# Patient Record
Sex: Female | Born: 1955 | Race: White | Hispanic: No | Smoking: Never smoker
Health system: Southern US, Community
[De-identification: ages and names within clinical notes are randomized; demographics above are authoritative.]

## PROBLEM LIST (undated history)

## (undated) DIAGNOSIS — Z96649 Presence of unspecified artificial hip joint: Secondary | ICD-10-CM

## (undated) DIAGNOSIS — K219 Gastro-esophageal reflux disease without esophagitis: Secondary | ICD-10-CM

## (undated) DIAGNOSIS — M21379 Foot drop, unspecified foot: Secondary | ICD-10-CM

## (undated) DIAGNOSIS — E039 Hypothyroidism, unspecified: Secondary | ICD-10-CM

## (undated) DIAGNOSIS — M810 Age-related osteoporosis without current pathological fracture: Secondary | ICD-10-CM

## (undated) DIAGNOSIS — F419 Anxiety disorder, unspecified: Secondary | ICD-10-CM

## (undated) DIAGNOSIS — F32A Depression, unspecified: Secondary | ICD-10-CM

## (undated) DIAGNOSIS — G629 Polyneuropathy, unspecified: Secondary | ICD-10-CM

## (undated) DIAGNOSIS — G8929 Other chronic pain: Secondary | ICD-10-CM

## (undated) DIAGNOSIS — I10 Essential (primary) hypertension: Secondary | ICD-10-CM

## (undated) DIAGNOSIS — G4730 Sleep apnea, unspecified: Secondary | ICD-10-CM

## (undated) DIAGNOSIS — G43909 Migraine, unspecified, not intractable, without status migrainosus: Secondary | ICD-10-CM

## (undated) DIAGNOSIS — F329 Major depressive disorder, single episode, unspecified: Secondary | ICD-10-CM

## (undated) HISTORY — PX: OTHER SURGICAL HISTORY: SHX169

## (undated) HISTORY — PX: JOINT REPLACEMENT: SHX530

## (undated) HISTORY — PX: PLANTAR FASCIA RELEASE: SHX2239

## (undated) HISTORY — DX: Presence of unspecified artificial hip joint: Z96.649

## (undated) HISTORY — PX: BACK SURGERY: SHX140

## (undated) HISTORY — PX: ABDOMINAL HYSTERECTOMY: SHX81

## (undated) HISTORY — PX: HIP SURGERY: SHX245

## (undated) HISTORY — DX: Essential (primary) hypertension: I10

## (undated) HISTORY — PX: CHOLECYSTECTOMY: SHX55

## (undated) HISTORY — PX: NEPHRECTOMY: SHX65

---

## 2008-03-14 ENCOUNTER — Emergency Department (HOSPITAL_COMMUNITY): Admission: EM | Admit: 2008-03-14 | Discharge: 2008-03-14 | Payer: Self-pay | Admitting: Emergency Medicine

## 2010-08-21 ENCOUNTER — Inpatient Hospital Stay (HOSPITAL_COMMUNITY): Admission: EM | Admit: 2010-08-21 | Discharge: 2010-08-25 | Payer: Self-pay | Admitting: Emergency Medicine

## 2010-08-21 ENCOUNTER — Ambulatory Visit: Payer: Self-pay | Admitting: Orthopedic Surgery

## 2010-08-22 ENCOUNTER — Encounter: Payer: Self-pay | Admitting: Orthopedic Surgery

## 2010-09-03 ENCOUNTER — Ambulatory Visit: Payer: Self-pay | Admitting: Orthopedic Surgery

## 2010-09-03 ENCOUNTER — Ambulatory Visit (HOSPITAL_COMMUNITY): Admission: RE | Admit: 2010-09-03 | Discharge: 2010-09-03 | Payer: Self-pay | Admitting: Orthopedic Surgery

## 2010-09-03 DIAGNOSIS — S72009A Fracture of unspecified part of neck of unspecified femur, initial encounter for closed fracture: Secondary | ICD-10-CM | POA: Insufficient documentation

## 2010-10-02 ENCOUNTER — Ambulatory Visit: Payer: Self-pay | Admitting: Orthopedic Surgery

## 2010-10-31 ENCOUNTER — Ambulatory Visit
Admission: RE | Admit: 2010-10-31 | Discharge: 2010-10-31 | Payer: Self-pay | Source: Home / Self Care | Attending: Orthopedic Surgery | Admitting: Orthopedic Surgery

## 2010-11-27 NOTE — Miscellaneous (Signed)
Summary: Nursing Home order  Nursing Home order   Imported By: Cammie Sickle 09/21/2010 14:28:55  _____________________________________________________________________  External Attachment:    Type:   Image     Comment:   External Document

## 2010-11-27 NOTE — Assessment & Plan Note (Signed)
Summary: 4 WK/RECK/? XRAY / POST OP RT HIP SURG 08/22/10 /MEDICARE/CAF   Visit Type:  Follow-up  CC:  post op hip.  History of Present Illness: I saw Victoria Bailey in the office today for a followup visit.  She is a 55 years old woman with the complaint of:  post op, right hip  DOI 08-21-10.  DOS 08-22-10. Right hip partial replacement.  Medications:Hydrocodone 10/325 mg, Robaxin 750 mg, Fentanyl patch 50 micrograms, Baclofen.  Today is 4 week recheck.  the patient is still at the skilled nursing facility in [place].  She is complaining of groin pain and thigh pain and weakness of her RIGHT leg.  She has an associated footdrop from her previous lumbar disc surgeries.  She is not gotten her new AFO  Radiographs will be taken today.      Physical Exam  Additional Exam:  on examination she asked he walks fairly well with a walker she has a mild foot drop it is partial.  She can lift her leg as long as her knee is bad but when it is straight she has trouble with hip flexion.  She has no tenderness in the thigh but deep dull aching.  With flexion-extension of the hip she has mild discomfort in the groin.     Impression & Recommendations:  Problem # 1:  CLOSED FRACTURE UNSPECIFIED PART NECK FEMUR (ICD-820.8) Assessment Comment Only  radiographs are taken of her RIGHT hip prosthesis and the RIGHT femur.  There no abnormality seen.  The prosthesis is in good position.  There is no heterotopic bone.  Impression normal appearing stable RIGHT hip bipolar replacement  At this point the patient tells me that she is ambulating about 100 feet, she can get in and out of bed by herself.  So we anticipate her leaving the facility in about a week and then transferring to outpatient or home therapy.  Orders: Post-Op Check (47829) Hip x-ray unilateral complete, minimum 2 views (73510) Pelvis x-ray, 1/2 views (72170) Femur x-ray,  2 views (56213)  Patient Instructions: 1)  Get AFO  brace for the right foot. 2)  Please schedule a follow-up appointment in 1 month.   Orders Added: 1)  Post-Op Check [99024] 2)  Hip x-ray unilateral complete, minimum 2 views [73510] 3)  Pelvis x-ray, 1/2 views [72170] 4)  Femur x-ray,  2 views [73550]

## 2010-11-27 NOTE — Letter (Signed)
Summary: History form  History form   Imported By: Jacklynn Ganong 09/06/2010 10:01:32  _____________________________________________________________________  External Attachment:    Type:   Image     Comment:   External Document

## 2010-11-27 NOTE — Assessment & Plan Note (Signed)
Summary: HOSP FOL/UP/POST OP RT HIP SURG 08/22/10/XRAY PRIOR,SAME DAY ...   Visit Type:  post op  CC:  post op right hip.  History of Present Illness: I saw Victoria Bailey in the office today for a followup visit.  She is a 55 years old woman with the complaint of:  post op, right hip  DOI 08-21-10.  DOS 08-22-10. Right hip partial replacement.  Medications:Hydrocodone 10/325 mg, Robaxin 750 mg, Fentanyl patch 50 micrograms.  Staples out today.   We took out every other staple and the wound edges looked to be a little bit separated so we LEFT the rest of the minimally taken out at the rehabilitation center.  The patient is at the rehabilitation center nonweightbearing secondary to a previously noted. Endoscopic plantar fascial release. Just prior to her fractured hip.  She is in rehabilitation and bleeding 100 feet nonweightbearing with a AFO device on her RIGHT foot, secondary to her foot drop, which is related to previous lumbar disc pathology, surgery.  She also has chronic pain, and sees a pain management specialist in [place];  other than that: Her incision looks good and her hip has minimal swelling.  Recommend four-week followup Staples to be removed in 3 days at the rehabilitation center. Once she sees Dr. Pricilla Holm. She can start weightbearing as tolerated    Impression & Recommendations:  Problem # 1:  CLOSED FRACTURE UNSPECIFIED PART NECK FEMUR (ICD-820.8) Assessment Improved computer digital images from the hospital, AP of the pelvis, AP, lateral, of the RIGHT hip shows a RIGHT partial hip replacement with alignment, similar to previously noted. Alignment on postop film has been no interval change.  Alignment satisfactory.  Patient will return in 4 weeks for clinical examination Orders: Post-Op Check [phone])  Patient Instructions: 1)  Rehab:  take staples out in 4 days  2)  return here in 4 weeks for check up    Orders Added: 1)  Post-Op Check  [[id number]]

## 2010-11-28 ENCOUNTER — Encounter: Payer: Self-pay | Admitting: Orthopedic Surgery

## 2010-11-29 NOTE — Assessment & Plan Note (Signed)
Summary: 1 mo reck hip/mcr/bsf   Visit Type:  Follow-up  CC:  recheck hip.  History of Present Illness: I saw Victoria Bailey in the office today for a followup visit.  She is a 55 years old woman with the complaint of:  post op, right hip  DOI 08-21-10.  DOS 08-22-10. Right hip partial replacement.  Medications:Hydrocodone 10/500 mg, Robaxin 750 mg, Fentanyl patch 50 micrograms, Baclofen.  Today is 4 week recheck, was to get AFO for her right foot, is in process has not received yet.  Walking today with cane.  Still pain in right thigh and lateral hip, no injuries.  Needs refill on the Hydrocodone 10/500.  Needs rx for outpatient therapy.  Now at home with her MOM           Impression & Recommendations:  Problem # 1:  CLOSED FRACTURE UNSPECIFIED PART NECK FEMUR (ICD-820.8)  several issues #1 is having groin pain and pain in the RIGHT proximal thigh radiating to the back, which we suspect is coming from iliopsoas muscle release from the lesser trochanter and combination of subclinical L1, L2, dysfunction.  Question about driving, probably not quite since she is still having weakness with straight leg raise. When lying down. She is much better with this when sitting.  Okay to go to outpatient therapy  Orders: Post-Op Check [phone])  Medications Added to Medication List This Visit: 1)  Hydrocodone-acetaminophen 10-500 Mg Tabs (Hydrocodone-acetaminophen) .Marland Kitchen.. 1 by mouth q 4 hrs as needed pain  Patient Instructions: 1)  Wait on driving 2)  Go for outpatient therapy, take order with you 3)  Get AFO 4)  Come back in 1 month Prescriptions: HYDROCODONE-ACETAMINOPHEN 10-500 MG TABS (HYDROCODONE-ACETAMINOPHEN) 1 by mouth q 4 hrs as needed pain  #84 x 2   Entered and Authorized by:   Fuller Canada MD   Signed by:   Fuller Canada MD on 10/31/2010   Method used:   Print then Give to Patient   RxID:   [id number]    Orders Added: 1)  Post-Op Check  [95621]

## 2010-12-04 ENCOUNTER — Ambulatory Visit (INDEPENDENT_AMBULATORY_CARE_PROVIDER_SITE_OTHER): Payer: Medicare Other | Admitting: Orthopedic Surgery

## 2010-12-04 ENCOUNTER — Encounter: Payer: Self-pay | Admitting: Orthopedic Surgery

## 2010-12-04 DIAGNOSIS — S72009A Fracture of unspecified part of neck of unspecified femur, initial encounter for closed fracture: Secondary | ICD-10-CM

## 2010-12-13 NOTE — Miscellaneous (Signed)
Summary: Rehab Report [facility]  Rehab Report [facility]   Imported By: Cammie Sickle 12/04/2010 19:36:00  _____________________________________________________________________  External Attachment:    Type:   Image     Comment:   External Document

## 2010-12-13 NOTE — Medication Information (Signed)
Summary: Tax adviser   Imported By: Cammie Sickle 12/04/2010 19:37:14  _____________________________________________________________________  External Attachment:    Type:   Image     Comment:   External Document

## 2010-12-13 NOTE — Assessment & Plan Note (Signed)
Summary: 1 mth reck hip/mcr/wkj   Visit Type:  Follow-up  CC:  RIGHT groin pain.  History of Present Illness:   55 year old female complains of RIGHT groin pain radiating to her back in a bandlike distribution.  Status post RIGHT partial hip replacement.  DOI 08-21-10.  DOS 08-22-10. Right hip partial replacement.  Medications:Hydrocodone 10/500 mg, Robaxin 750 mg, Fentanyl patch 50 micrograms, Baclofen.  ALLERGIES to sulfa, doxycycline.  In tolerance to OxyContin makes her sick    The patient states she is better than she was a month ago.  She has better strength and endurance and she is ambulating better although with a cane.  She still complains of a ropelike sensation around the groin area which radiates up into her back.  She has a history of chronic back pain with radiculopathy and RIGHT foot drop.  Therapy notes indicate patient has improved significantly since her last visit.  She has an AFO now and also has 1/4 inch heel lift in her shoe.  She feels like she is walking unbalanced.           Review of Systems Constitutional:  Denies fever. Neurologic:  Complains of unsteady gait; denies numbness and tingling. Musculoskeletal:  Complains of joint pain, instability, and muscle pain.  Physical Exam  Additional Exam:   She is a lady with a cane with a slight abductor lurch  She has leg length discrepancy when standing which is improved when they heel lifts are removed from the shoe however, she still has the AFO which is contributing.  She did block test today and I balanced her with 1/4 inch lift in the LEFT.  I would like to try to remove the quarter inch heel lifts from the RIGHT shoe and see if this helps her out.  I would also like to start her on some amitriptyline 10 mg at night to help with her radicular groin pain.     Medications Added to Medication List This Visit: 1)  Amitriptyline Hcl 10 Mg Tabs (Amitriptyline hcl) .... Take one at night by  mouth  Other Orders: Physical Therapy Referral (PT) Est. Patient Level III (16109)  Patient Instructions: 1)  continue therapy for a month 2)  fu in a month Prescriptions: AMITRIPTYLINE HCL 10 MG TABS (AMITRIPTYLINE HCL) take one at night by mouth  #30 x 1   Entered and Authorized by:   Fuller Canada MD   Signed by:   Fuller Canada MD on 12/04/2010   Method used:   Handwritten   RxID:   [id number]    Orders Added: 1)  Physical Therapy Referral [PT] 2)  Est. Patient Level III [95621]

## 2011-01-03 ENCOUNTER — Ambulatory Visit (INDEPENDENT_AMBULATORY_CARE_PROVIDER_SITE_OTHER): Payer: Medicare Other | Admitting: Orthopedic Surgery

## 2011-01-03 ENCOUNTER — Encounter: Payer: Self-pay | Admitting: Orthopedic Surgery

## 2011-01-03 DIAGNOSIS — S72009A Fracture of unspecified part of neck of unspecified femur, initial encounter for closed fracture: Secondary | ICD-10-CM

## 2011-01-08 ENCOUNTER — Telehealth: Payer: Self-pay | Admitting: Orthopedic Surgery

## 2011-01-08 NOTE — Assessment & Plan Note (Signed)
Summary: 1 m RE-CK RT HIP FOL'G PT/?XRAY/MEDICARE/BCBS ANTHEM/CAF   Visit Type:  Follow-up  CC:  recheck hip.  History of Present Illness:  55 year old female complains of RIGHT groin pain radiating to her back in a bandlike distribution.  4 mos status post RIGHT partial hip replacement.  DOI 08-21-10.  DOS 08-22-10. Right hip partial replacement.  Medications:Hydrocodone 10/500 mg, Robaxin 750 mg, Fentanyl patch 50 micrograms, Baclofen and Amitriptyline.  Today is a one month followup since her last visit. She is still undergoing physical therapy and she uses a cane and she has a RIGHT foot AFO for footdrop.  The amitriptyline seems to be helping. The pain that she is having radiating to the back from the groin.  ALLERGIES to sulfa, doxycycline.  In tolerance to OxyContin makes her sick  PT seems to be going well. Patient reports getting more strength, but still has weakness in the RIGHT leg.  Sees Dr. Felix Ahmadi in [place]/ pain management        Physical Exam  Additional Exam:  RIGHT leg length discrepancy, approximately 1 half-inch measure 5 block test. When she is seated. There is no discrepancy however, when she is lying flat. The discrepancy measures at half-inch.  She can now perform a straight leg raise. She has normal flexion of the hip with the knee bent.  She ambulates with a Trendelenburg gait.   Impression & Recommendations:  Problem # 1:  CLOSED FRACTURE UNSPECIFIED PART NECK FEMUR (ICD-820.8) Assessment Improved  x-ray review shows discrepancy on the AP, pelvis.  She has a foot drop, which is getting better and her doctors would like her to take off the AFO and she is agreeable to that however, that when require a shoe lift on the RIGHT upper proximally. Half inch. We measured from the block test, and three quarters of an inch seem to be too much of a LEFT.  She will continue therapy to strengthen her abductors. Prior to her hip surgery. She had a foot  drop, which indicates that there is some L5 nerve root involvement. She's had lower lumbar surgery as well. That taken with the normal weakness seen after direct lateral approach to the hip would explain her persistent abductor weakness.  The leg length discrepancy. He seems to be related to the hip and the back.  Orders: Est. Patient Level III (14782)  Patient Instructions: 1)  continue Amitriptiline  2)  1/2 in lift  3)  f/u 1 month  4)  continue PT    Orders Added: 1)  Est. Patient Level III [95621]

## 2011-01-09 LAB — CBC
HCT: 25.8 % — ABNORMAL LOW (ref 36.0–46.0)
Hemoglobin: 10.1 g/dL — ABNORMAL LOW (ref 12.0–15.0)
Hemoglobin: 11.1 g/dL — ABNORMAL LOW (ref 12.0–15.0)
Hemoglobin: 13 g/dL (ref 12.0–15.0)
Hemoglobin: 8.8 g/dL — ABNORMAL LOW (ref 12.0–15.0)
MCH: 30.4 pg (ref 26.0–34.0)
MCHC: 33.7 g/dL (ref 30.0–36.0)
MCHC: 35.6 g/dL (ref 30.0–36.0)
Platelets: 207 10*3/uL (ref 150–400)
RBC: 2.88 MIL/uL — ABNORMAL LOW (ref 3.87–5.11)
RBC: 4.26 MIL/uL (ref 3.87–5.11)
RDW: 13.1 % (ref 11.5–15.5)
RDW: 13.3 % (ref 11.5–15.5)
RDW: 13.7 % (ref 11.5–15.5)
WBC: 7 10*3/uL (ref 4.0–10.5)
WBC: 7.4 10*3/uL (ref 4.0–10.5)

## 2011-01-09 LAB — COMPREHENSIVE METABOLIC PANEL
ALT: 119 U/L — ABNORMAL HIGH (ref 0–35)
AST: 160 U/L — ABNORMAL HIGH (ref 0–37)
Albumin: 3.3 g/dL — ABNORMAL LOW (ref 3.5–5.2)
CO2: 26 mEq/L (ref 19–32)
Calcium: 8.9 mg/dL (ref 8.4–10.5)
Chloride: 109 mEq/L (ref 96–112)
Creatinine, Ser: 1.14 mg/dL (ref 0.4–1.2)
GFR calc Af Amer: >60 mL/min (ref >60)
GFR calc non Af Amer: 50 mL/min — ABNORMAL LOW (ref >60)
Sodium: 142 mEq/L (ref 135–145)
Total Bilirubin: 0.8 mg/dL (ref 0.3–1.2)

## 2011-01-09 LAB — BASIC METABOLIC PANEL
BUN: 5 mg/dL — ABNORMAL LOW (ref 6–23)
BUN: 6 mg/dL (ref 6–23)
CO2: 27 mEq/L (ref 19–32)
Calcium: 8.6 mg/dL (ref 8.4–10.5)
Calcium: 9 mg/dL (ref 8.4–10.5)
Creatinine, Ser: 1.17 mg/dL (ref 0.4–1.2)
GFR calc non Af Amer: 47 mL/min — ABNORMAL LOW (ref >60)
GFR calc non Af Amer: 48 mL/min — ABNORMAL LOW (ref >60)
GFR calc non Af Amer: 51 mL/min — ABNORMAL LOW (ref >60)
Glucose, Bld: 108 mg/dL — ABNORMAL HIGH (ref 70–99)
Glucose, Bld: 128 mg/dL — ABNORMAL HIGH (ref 70–99)
Potassium: 3.4 mEq/L — ABNORMAL LOW (ref 3.5–5.1)
Sodium: 137 mEq/L (ref 135–145)
Sodium: 141 mEq/L (ref 135–145)
Sodium: 142 mEq/L (ref 135–145)

## 2011-01-09 LAB — ABO/RH: ABO/RH(D): A NEG

## 2011-01-09 LAB — DIFFERENTIAL
Basophils Absolute: 0 10*3/uL (ref 0.0–0.1)
Basophils Absolute: 0 10*3/uL (ref 0.0–0.1)
Basophils Absolute: 0 10*3/uL (ref 0.0–0.1)
Basophils Relative: 0 % (ref 0–1)
Basophils Relative: 0 % (ref 0–1)
Eosinophils Absolute: 0 10*3/uL (ref 0.0–0.7)
Eosinophils Absolute: 0.1 10*3/uL (ref 0.0–0.7)
Eosinophils Relative: 1 % (ref 0–5)
Lymphocytes Relative: 11 % — ABNORMAL LOW (ref 12–46)
Lymphocytes Relative: 11 % — ABNORMAL LOW (ref 12–46)
Lymphocytes Relative: 9 % — ABNORMAL LOW (ref 12–46)
Lymphs Abs: 0.5 10*3/uL — ABNORMAL LOW (ref 0.7–4.0)
Lymphs Abs: 0.8 10*3/uL (ref 0.7–4.0)
Monocytes Absolute: 0.3 10*3/uL (ref 0.1–1.0)
Monocytes Absolute: 0.5 10*3/uL (ref 0.1–1.0)
Monocytes Relative: 7 % (ref 3–12)
Monocytes Relative: 9 % (ref 3–12)
Neutro Abs: 5.5 10*3/uL (ref 1.7–7.7)
Neutro Abs: 5.6 10*3/uL (ref 1.7–7.7)
Neutro Abs: 6 10*3/uL (ref 1.7–7.7)
Neutrophils Relative %: 78 % — ABNORMAL HIGH (ref 43–77)
Neutrophils Relative %: 81 % — ABNORMAL HIGH (ref 43–77)

## 2011-01-09 LAB — CROSSMATCH
ABO/RH(D): A NEG
Antibody Screen: NEGATIVE
Unit division: 0

## 2011-01-09 LAB — URINALYSIS, ROUTINE W REFLEX MICROSCOPIC
Nitrite: NEGATIVE
Specific Gravity, Urine: 1.01 (ref 1.005–1.030)
Urobilinogen, UA: 0.2 mg/dL (ref 0.0–1.0)
pH: 7 (ref 5.0–8.0)

## 2011-01-09 LAB — URINE CULTURE: Culture: NO GROWTH

## 2011-01-09 LAB — PROTIME-INR: Prothrombin Time: 12.7 seconds (ref 11.6–15.2)

## 2011-01-09 LAB — SAMPLE TO BLOOD BANK

## 2011-01-15 NOTE — Progress Notes (Signed)
Summary: needs PT order  Phone Note Call from Patient   Summary of Call: [facility]  needs a new order for Dia Sitter to continue PT Initial call taken by: Jacklynn Ganong,  January 08, 2011 4:02 PM  Follow-up for Phone Call        ok faxed Follow-up by: Ether Griffins,  January 09, 2011 9:01 AM

## 2011-01-31 ENCOUNTER — Telehealth: Payer: Self-pay | Admitting: Orthopedic Surgery

## 2011-01-31 NOTE — Telephone Encounter (Signed)
Is she allergic to any medications

## 2011-01-31 NOTE — Telephone Encounter (Signed)
Per call from Dr Karna Dupes (dentist) office - ph [phone]  - please prescribe RX for pre-medication, due to patient hx of partial hip replacement.  Call made to patient per call about pre-med per Dr.Grogan, [place] - LEFT MSG ON ANSWER MACHINE- NEED PATIENT TO CALL BACK WITH NAME OF PHARMACY.

## 2011-01-31 NOTE — Telephone Encounter (Signed)
Patient gave info:  [facility] [address],[place] [facility] [phone] for pre-med dental Rx. Patient states allergic to Doxycycline

## 2011-02-04 NOTE — Telephone Encounter (Signed)
I cant find the Info on prescribing Antibiotics for Total relpacements, please advise which med is accurate for her to take before dentist visit, thanks

## 2011-02-06 ENCOUNTER — Other Ambulatory Visit (INDEPENDENT_AMBULATORY_CARE_PROVIDER_SITE_OTHER): Payer: Medicare Other | Admitting: *Deleted

## 2011-02-06 DIAGNOSIS — Z96649 Presence of unspecified artificial hip joint: Secondary | ICD-10-CM

## 2011-02-06 MED ORDER — CEPHALEXIN 500 MG PO CAPS: 500.0000 mg | ORAL_CAPSULE | ORAL | Status: AC

## 2011-02-06 NOTE — Telephone Encounter (Signed)
Keflex 500 mg 4 tabs 1 hour before procedure

## 2011-02-06 NOTE — Telephone Encounter (Signed)
Keflex 500 mg 4 tabs 1 hour before

## 2011-02-11 ENCOUNTER — Other Ambulatory Visit: Payer: Self-pay | Admitting: *Deleted

## 2011-02-11 DIAGNOSIS — R52 Pain, unspecified: Secondary | ICD-10-CM

## 2011-02-11 MED ORDER — AMITRIPTYLINE HCL 10 MG PO TABS: 10.0000 mg | ORAL_TABLET | Freq: Every day | ORAL | Status: DC

## 2011-02-14 ENCOUNTER — Ambulatory Visit: Payer: Medicare Other | Admitting: Orthopedic Surgery

## 2011-02-27 ENCOUNTER — Ambulatory Visit (HOSPITAL_COMMUNITY)
Admission: RE | Admit: 2011-02-27 | Discharge: 2011-02-27 | Disposition: A | Discharge status: Home / Self Care | Payer: Medicare Other | Source: Ambulatory Visit | Attending: Orthopedic Surgery | Admitting: Orthopedic Surgery

## 2011-02-27 ENCOUNTER — Other Ambulatory Visit: Payer: Self-pay | Admitting: Orthopedic Surgery

## 2011-02-27 ENCOUNTER — Ambulatory Visit: Payer: Medicare Other | Admitting: Orthopedic Surgery

## 2011-02-27 ENCOUNTER — Encounter: Payer: Self-pay | Admitting: Orthopedic Surgery

## 2011-02-27 ENCOUNTER — Ambulatory Visit (INDEPENDENT_AMBULATORY_CARE_PROVIDER_SITE_OTHER): Payer: Medicare Other | Admitting: Orthopedic Surgery

## 2011-02-27 DIAGNOSIS — M25559 Pain in unspecified hip: Secondary | ICD-10-CM

## 2011-02-27 DIAGNOSIS — M79609 Pain in unspecified limb: Secondary | ICD-10-CM | POA: Insufficient documentation

## 2011-02-27 DIAGNOSIS — T1490XA Injury, unspecified, initial encounter: Secondary | ICD-10-CM

## 2011-02-27 DIAGNOSIS — R52 Pain, unspecified: Secondary | ICD-10-CM

## 2011-02-27 DIAGNOSIS — S92919A Unspecified fracture of unspecified toe(s), initial encounter for closed fracture: Secondary | ICD-10-CM | POA: Insufficient documentation

## 2011-02-27 DIAGNOSIS — X58XXXA Exposure to other specified factors, initial encounter: Secondary | ICD-10-CM | POA: Insufficient documentation

## 2011-02-27 MED ORDER — HYDROCODONE-ACETAMINOPHEN 10-500 MG PO TABS: 1.0000 | ORAL_TABLET | ORAL | Status: DC | PRN

## 2011-02-27 MED ORDER — AMITRIPTYLINE HCL 10 MG PO TABS: ORAL_TABLET | ORAL | Status: DC

## 2011-02-27 NOTE — Progress Notes (Signed)
Visit for this 55 year old female had a RIGHT bipolar hip replacement on August 22, 2010  Apparently she fell 3 weeks ago, so she did not injure her hip.  She is complaining of 5/10 pain in the RIGHT hip and RIGHT leg. Apparently, while doing physical therapy. A leg press exercise was done, and she started having increased pain at that time.  She was recently placed on amitriptyline for some radicular pain. She was having in the proximal thigh radiating from her back.  She was also given a half-inch lift on the right  Foot    Her LEFT great toe is tender and she injured it when she fell. There is crepitance at the IP joint, so we aren't going to get an x-ray [facility].  She has a slight limb length discrepancy with the half-inch shoe lift, but she has weakness there so this is advantageous versus absolute equality or longer on the RIGHT.  She has weakness in the abductors.  Advised 3 sets of 15, abduction exercises with a small weight on the foot, progress as tolerated. No leg presses.  Followup one month increase amytriptiline to 20 mg at night

## 2011-02-27 NOTE — Patient Instructions (Signed)
I will call you with xray result

## 2011-03-02 ENCOUNTER — Emergency Department (HOSPITAL_COMMUNITY)
Admission: EM | Admit: 2011-03-02 | Discharge: 2011-03-02 | Disposition: A | Discharge status: Home / Self Care | Payer: Worker's Compensation | Attending: Emergency Medicine | Admitting: Emergency Medicine

## 2011-03-02 DIAGNOSIS — G43909 Migraine, unspecified, not intractable, without status migrainosus: Secondary | ICD-10-CM | POA: Insufficient documentation

## 2011-03-02 DIAGNOSIS — M545 Low back pain, unspecified: Secondary | ICD-10-CM | POA: Insufficient documentation

## 2011-03-02 DIAGNOSIS — F411 Generalized anxiety disorder: Secondary | ICD-10-CM | POA: Insufficient documentation

## 2011-03-02 DIAGNOSIS — G8929 Other chronic pain: Secondary | ICD-10-CM | POA: Insufficient documentation

## 2011-03-02 DIAGNOSIS — F329 Major depressive disorder, single episode, unspecified: Secondary | ICD-10-CM | POA: Insufficient documentation

## 2011-03-02 DIAGNOSIS — Z79899 Other long term (current) drug therapy: Secondary | ICD-10-CM | POA: Insufficient documentation

## 2011-03-02 DIAGNOSIS — M79609 Pain in unspecified limb: Secondary | ICD-10-CM | POA: Insufficient documentation

## 2011-03-02 DIAGNOSIS — F3289 Other specified depressive episodes: Secondary | ICD-10-CM | POA: Insufficient documentation

## 2011-03-02 DIAGNOSIS — M810 Age-related osteoporosis without current pathological fracture: Secondary | ICD-10-CM | POA: Insufficient documentation

## 2011-04-02 ENCOUNTER — Ambulatory Visit (INDEPENDENT_AMBULATORY_CARE_PROVIDER_SITE_OTHER): Payer: Medicare Other | Admitting: Orthopedic Surgery

## 2011-04-02 ENCOUNTER — Encounter: Payer: Self-pay | Admitting: Orthopedic Surgery

## 2011-04-02 DIAGNOSIS — G589 Mononeuropathy, unspecified: Secondary | ICD-10-CM | POA: Insufficient documentation

## 2011-04-02 DIAGNOSIS — M25559 Pain in unspecified hip: Secondary | ICD-10-CM | POA: Insufficient documentation

## 2011-04-02 MED ORDER — AMITRIPTYLINE HCL 10 MG PO TABS: ORAL_TABLET | ORAL | Status: DC

## 2011-04-02 NOTE — Progress Notes (Signed)
Visit for this 55 year old female had a RIGHT bipolar hip replacement on August 22, 2010  Several issues since the last visit. Continues to have pain in the upper thigh and now radicular pain in her RIGHT leg after twisting injury causing her to have back pain and increase her radicular symptoms.  She had to stop her physical therapy.  Other issues, chronic pain.  Footdrop with need of AFO.  Lumbar disc disease.  She is very upset today because she can't go to therapy. She is having increased pain in the leg. We will take an x-ray of the prosthesis. There's been some concern in therapy that the hip was "popping out.  She has abductor weakness.  I tried to reassure her that her Other problems are causing issues with her RIGHT leg.  As far as the examination does I did a shuck test on her there was no shuck there is no pain there is no instability  Does have the limb length inequality as stated previously.    Plan rest for many therapy. Right now until her back issues get settled.  I recommend she see her neurosurgeon to reevaluate the radicular pain.  Followup one mo.  Addendum AP and lateral RIGHT hip no complicating features.  On previous pelvic x-rays we note a three-quarter inch leg length discrepancy which is clinically noted on the block test and we recommended 1/2 inch lift.

## 2011-04-02 NOTE — Progress Notes (Signed)
AP lateral RIGHT hip RIGHT hip prosthesis is seen in place in good position  No cup obtaining features x-ray was taken because patient was complaining of RIGHT thigh pain

## 2011-05-14 ENCOUNTER — Ambulatory Visit (INDEPENDENT_AMBULATORY_CARE_PROVIDER_SITE_OTHER): Payer: Medicare Other | Admitting: Orthopedic Surgery

## 2011-05-14 DIAGNOSIS — M7060 Trochanteric bursitis, unspecified hip: Secondary | ICD-10-CM

## 2011-05-14 DIAGNOSIS — M76899 Other specified enthesopathies of unspecified lower limb, excluding foot: Secondary | ICD-10-CM

## 2011-05-14 DIAGNOSIS — M25559 Pain in unspecified hip: Secondary | ICD-10-CM

## 2011-05-14 DIAGNOSIS — M25551 Pain in right hip: Secondary | ICD-10-CM

## 2011-05-14 MED ORDER — METHYLPREDNISOLONE ACETATE 40 MG/ML IJ SUSP: 40.0000 mg | Freq: Once | INTRAMUSCULAR | Status: AC

## 2011-05-14 NOTE — Procedures (Signed)
Injection LEFT hip  Verbal consent.  Diagnosis RIGHT hip pain 40 mg of Depo-Medrol and 4 cc of 1% lidocaine plain.  Sterile injection RIGHT hip greater trochanter No complications   The same injection was repeated distal to the incision.

## 2011-05-14 NOTE — Progress Notes (Signed)
55 year old female had a RIGHT bipolar hip replacement on August 22, 2010:  She is here for followup today.  She had epidural injections. I believe, or at least some back injections, and she seems to think that her back is gotten a lot better. She still has some medial thigh pain. Some anterior thigh pain and some pain over the greater trochanter at the distal portion of her incision.  Exam shows that she is tender in these areas. She has good hip flexion, although some weakness. She has persistent weakness of her abductors.  I gave her an injection at the distal end of the incision and also over the greater trochanter for bursitis.  She complained of some pain over the RIGHT great toe and exam. There shows tenderness over the dorsum with a palpable prominence, consistent with a dorsal bunion, although she has a painless passive range of motion except for terminal motion.  Plan is to screw start physical therapy with limited exercises for strengthening of the hip flexors and abductors.

## 2011-05-14 NOTE — Patient Instructions (Signed)
You have received a steroid shot. 15% of patients experience increased pain at the injection site with in the next 24 hours. This is best treated with ice and tylenol extra strength 2 tabs every 8 hours. If you are still having pain please call the office.    

## 2011-05-20 ENCOUNTER — Other Ambulatory Visit: Payer: Self-pay | Admitting: *Deleted

## 2011-05-20 DIAGNOSIS — G589 Mononeuropathy, unspecified: Secondary | ICD-10-CM

## 2011-05-20 DIAGNOSIS — M25559 Pain in unspecified hip: Secondary | ICD-10-CM

## 2011-05-20 MED ORDER — AMITRIPTYLINE HCL 10 MG PO TABS: ORAL_TABLET | ORAL | Status: DC

## 2011-05-22 NOTE — Telephone Encounter (Signed)
Done

## 2011-05-30 ENCOUNTER — Telehealth: Payer: Self-pay | Admitting: Orthopedic Surgery

## 2011-05-30 NOTE — Telephone Encounter (Signed)
Victoria Bailey says she is having trouble getting the pharmacy to refill her Elavil, changed to a 90 day supply but the pharmacy says they are waiting on authorization from Dr. Romeo Apple.  She has been out of this for 2 weeks now.  Uses [facility] in [place], [place] they have faxed request. Victoria Bailey's # [phone]

## 2011-05-30 NOTE — Telephone Encounter (Signed)
The patient called back, said she had just spoken with her insurance company and the pharmacy and was told to come pick up her medicine

## 2011-06-18 ENCOUNTER — Ambulatory Visit (INDEPENDENT_AMBULATORY_CARE_PROVIDER_SITE_OTHER): Payer: Medicare Other | Admitting: Orthopedic Surgery

## 2011-06-18 ENCOUNTER — Encounter: Payer: Self-pay | Admitting: Orthopedic Surgery

## 2011-06-18 DIAGNOSIS — M25559 Pain in unspecified hip: Secondary | ICD-10-CM

## 2011-06-18 DIAGNOSIS — M25551 Pain in right hip: Secondary | ICD-10-CM

## 2011-06-18 DIAGNOSIS — M7060 Trochanteric bursitis, unspecified hip: Secondary | ICD-10-CM

## 2011-06-18 DIAGNOSIS — M76899 Other specified enthesopathies of unspecified lower limb, excluding foot: Secondary | ICD-10-CM

## 2011-06-18 NOTE — Progress Notes (Signed)
55 year old female had a RIGHT bipolar hip replacement on August 22, 2010  Status post 2 injections in the RIGHT hip area for local pain. Patient says that helped her significantly.  She was unable to go to therapy due to a death in the family.  She would like to repeat those injections as she said they helped her walk much better. She has less pain and spasms in the RIGHT hip and thigh.  2 injections RIGHT hip localized.  Hip Injection x 2  Verbal consent was obtained  Timeout procedure was completed for both injections  Alcohol was used as a prep with ethyl chloride as an anesthetizing agent  Using a 22-gauge spinal needle the PMT was injected with Depo-Medrol 40 mg.  We also diluted out with 3 cc 1% plain lidocaine

## 2011-06-18 NOTE — Patient Instructions (Signed)
Come back in a month recheck on the hip after injections  Wait on therapy right now

## 2011-07-18 ENCOUNTER — Ambulatory Visit (INDEPENDENT_AMBULATORY_CARE_PROVIDER_SITE_OTHER): Payer: Medicare Other | Admitting: Orthopedic Surgery

## 2011-07-18 ENCOUNTER — Encounter: Payer: Self-pay | Admitting: Orthopedic Surgery

## 2011-07-18 DIAGNOSIS — M7070 Other bursitis of hip, unspecified hip: Secondary | ICD-10-CM

## 2011-07-18 DIAGNOSIS — M76899 Other specified enthesopathies of unspecified lower limb, excluding foot: Secondary | ICD-10-CM

## 2011-07-18 MED ORDER — METHYLPREDNISOLONE ACETATE 40 MG/ML IJ SUSP: 40.0000 mg | Freq: Once | INTRAMUSCULAR | Status: AC

## 2011-07-18 NOTE — Patient Instructions (Signed)
You have received a steroid shot. 15% of patients experience increased pain at the injection site with in the next 24 hours. This is best treated with ice and tylenol extra strength 2 tabs every 8 hours. If you are still having pain please call the office.    

## 2011-07-18 NOTE — Progress Notes (Signed)
Followup visit status post bipolar replacement RIGHT hip.  History of chronic pain, and chronic back pain with residual foot drop.  History postoperative bursitis, treated with 2 injections, who complains of similar pain over the RIGHT greater trochanter.  The patient has shortening of the RIGHT limb, requiring a shoe lift which she is not wearing today.  He's ambulating with a slight limp and Trendelenburg gait.  She is tender over the RIGHT greater trochanter and has pain in the anterior hip joint and thigh, going from a flexed position to an extended position.  We injected the RIGHT greater trochanter,  She'll come back in a month for reevaluation of the RIGHT hip and a postoperative one-year film.  Separate procedure report  Injection RIGHT greater trochanter.  Verbal consent, timeout to confirm procedure.  Alcohol was used to prep the skin with ethyl chloride as an anesthetic.  A 25-gauge needle was used to inject 40 mg of Depo-Medrol and 2 cc of lidocaine 1%.  This was well tolerated without complication.  A clean bandage was placed over the wound.

## 2011-08-14 ENCOUNTER — Ambulatory Visit: Payer: Medicare Other | Admitting: Orthopedic Surgery

## 2011-08-15 ENCOUNTER — Ambulatory Visit (INDEPENDENT_AMBULATORY_CARE_PROVIDER_SITE_OTHER): Payer: Medicare Other | Admitting: Orthopedic Surgery

## 2011-08-15 ENCOUNTER — Encounter: Payer: Self-pay | Admitting: Orthopedic Surgery

## 2011-08-15 VITALS — BP 158/100 | Ht 67.0 in | Wt 150.2 lb

## 2011-08-15 DIAGNOSIS — S72009A Fracture of unspecified part of neck of unspecified femur, initial encounter for closed fracture: Secondary | ICD-10-CM

## 2011-08-15 NOTE — Progress Notes (Signed)
One year follow-up status post bipolar RIGHT hip replacement for hip fracture.  Situation complicated by history of chronic pain and RIGHT foot drop treated with AFO brace.  Seems to have bursitis of the RIGHT greater trochanter.  Separate x-ray report:  2 views of the RIGHT hip prosthesis.  There's been no loosening or malalignment. The implant is in good position.  Impression stable RIGHT hip implant.  Patient does complain of some radicular pain into the RIGHT knee.  She is having difficulty lying on the RIGHT side.  She is on Topamax for radicular pain. I would like her to call her physician and see if he can increase the dose as I am not familiar with dosing. That medication.

## 2011-09-14 IMAGING — CR DG CHEST 1V
1 series · 1 of 1 positions shown · non-contrast
Comparison: None.

CLINICAL DATA: Fell at home with right-sided pain

CHEST - 1 VIEW

[view not recorded]
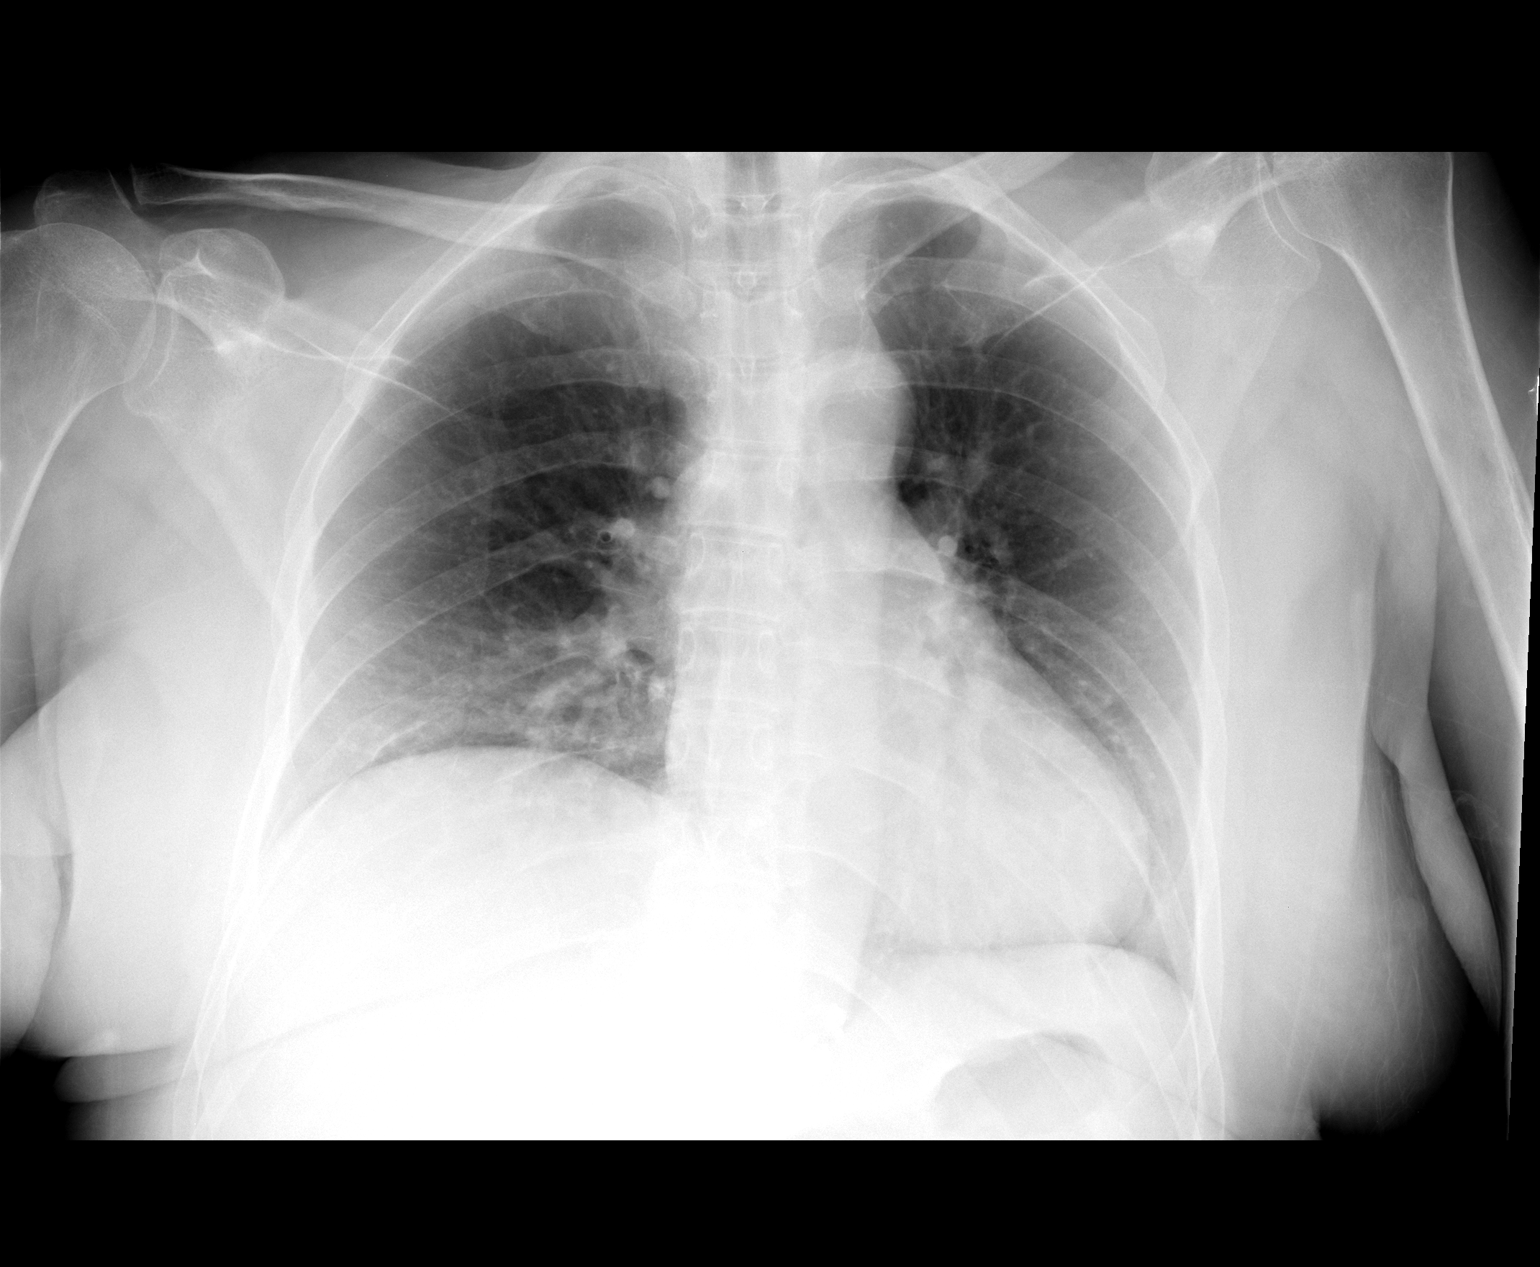

[1 of 1 positions shown; findings below may reference images not displayed]

FINDINGS: No active infiltrate or effusion is seen, with mild
volume loss at the right lung base.  There is slight irregularity
of the right posterolateral seventh rib which probably represents
an old fracture.  No definite acute fracture is noted.  No
pneumothorax is seen.  Mediastinal contours are normal.  The heart
is mildly enlarged.
IMPRESSION: 1.  Mild right basilar atelectasis.
2.  Probable old fracture of the right posterolateral seventh rib.
Correlate clinically.

## 2011-09-14 IMAGING — CR DG FEMUR 2+V*R*
2 series · 2 of 2 positions shown · non-contrast
Comparison: None

CLINICAL DATA: Fell.  Right leg pain.

RIGHT FEMUR - 2 VIEW

[view not recorded (1 of 2)]
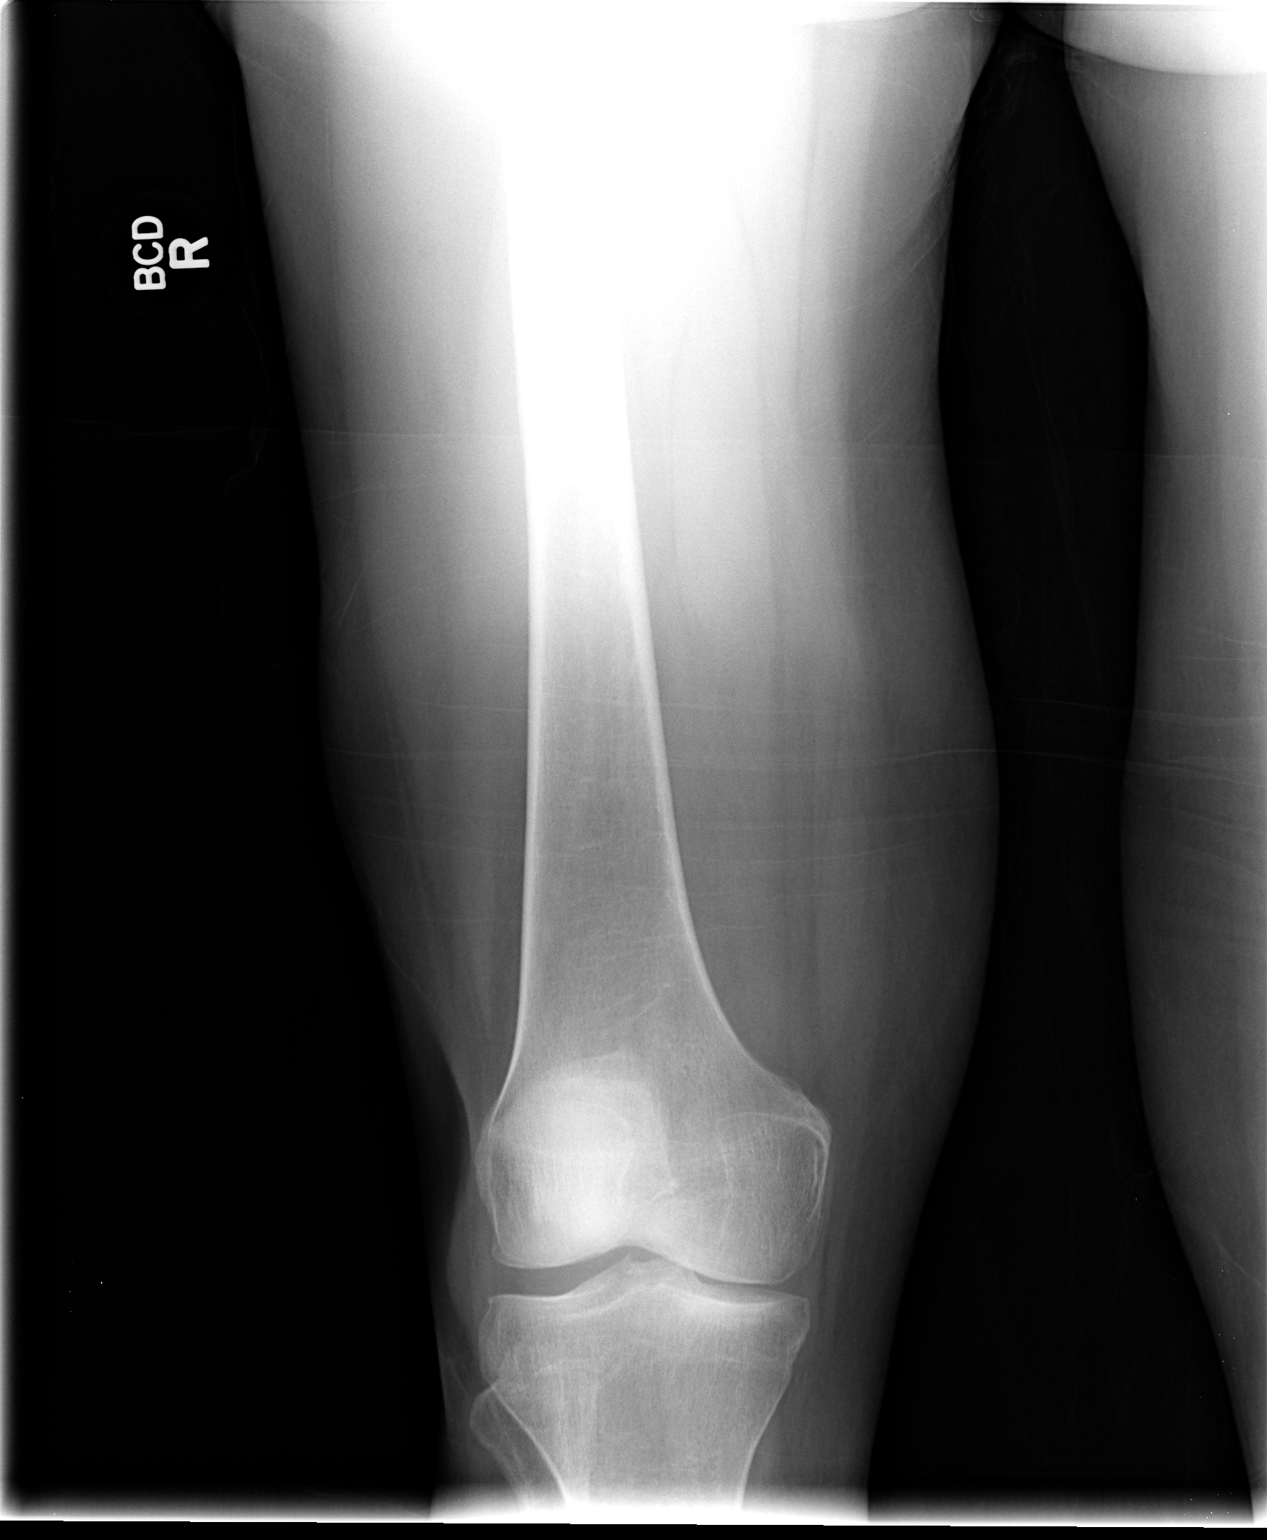

[view not recorded (2 of 2)]
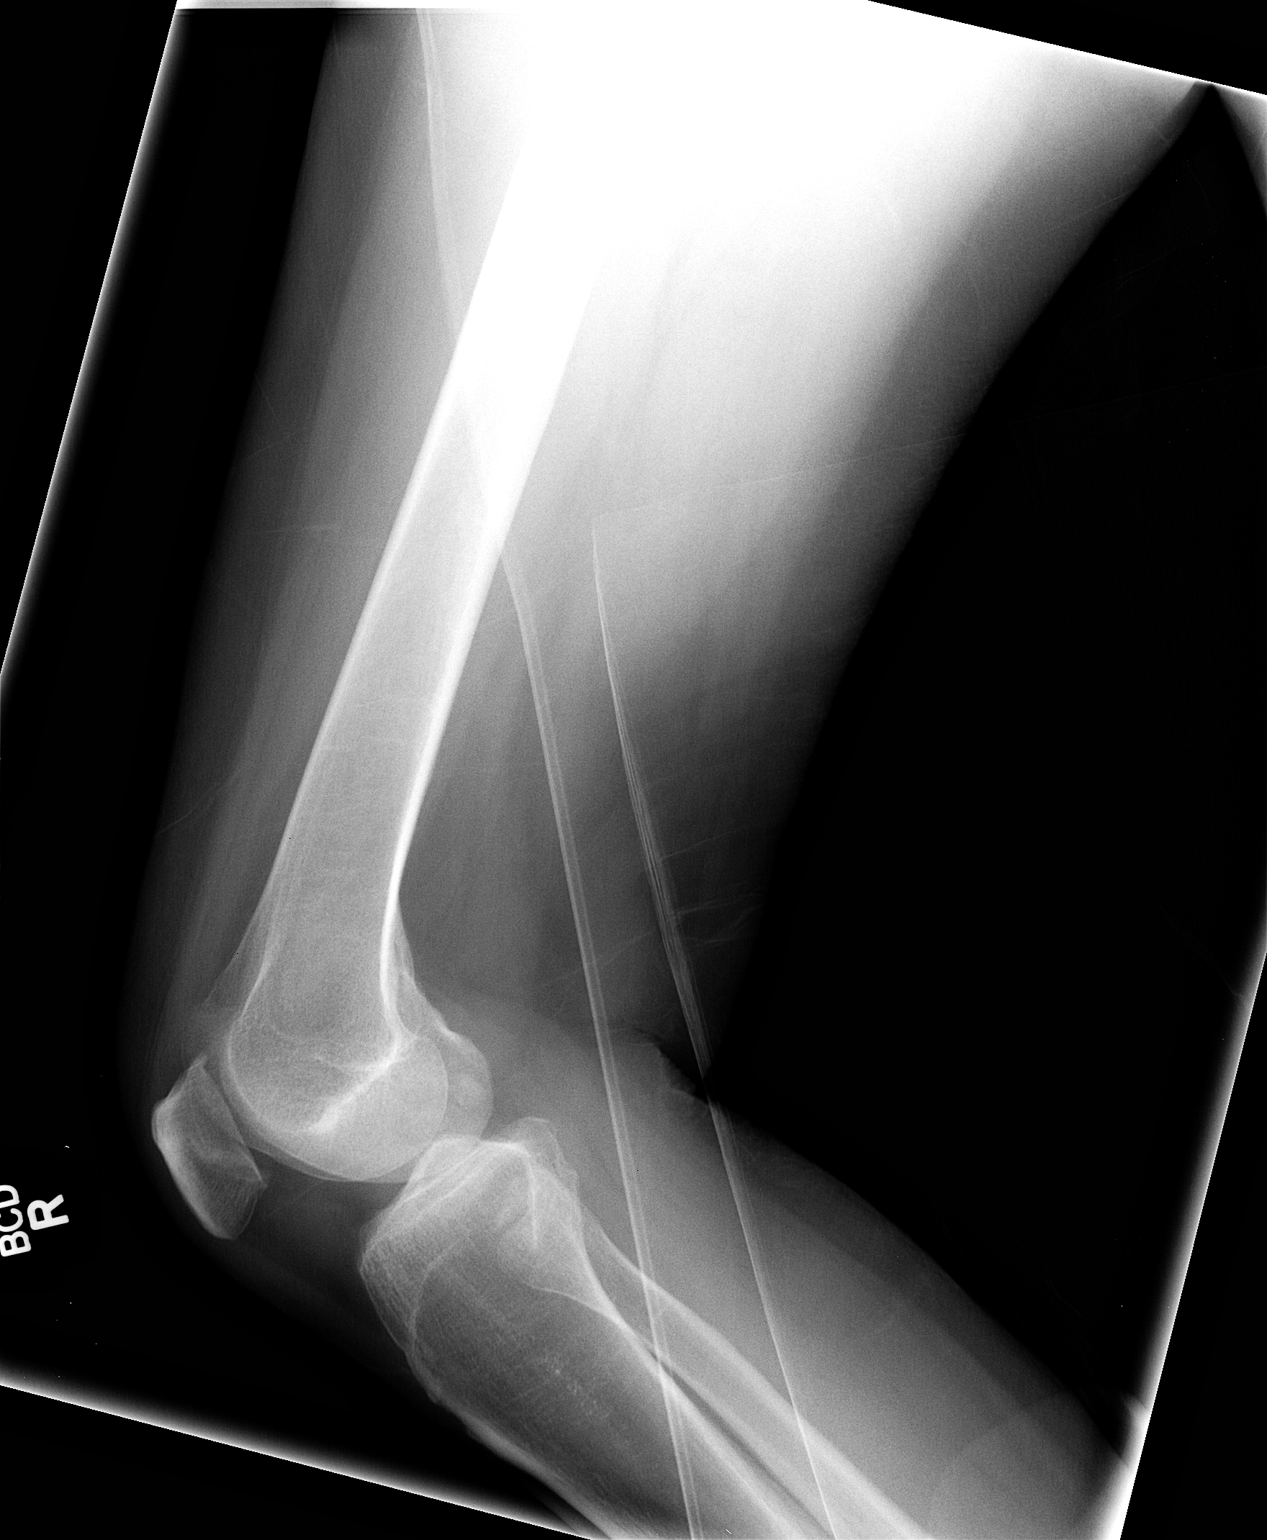

[2 of 2 positions shown; findings below may reference images not displayed]

FINDINGS: No femoral shaft fractures are identified.  The joint is
maintained.
IMPRESSION: No femoral shaft fracture.

## 2011-09-15 IMAGING — CR DG HIP OPERATIVE*R*
1 series · 1 of 1 positions shown · non-contrast
Comparison: Portable exam 0586 hours compared to 08/21/2010

CLINICAL DATA: Right hip fracture

OPERATIVE RIGHT HIP

[view not recorded]
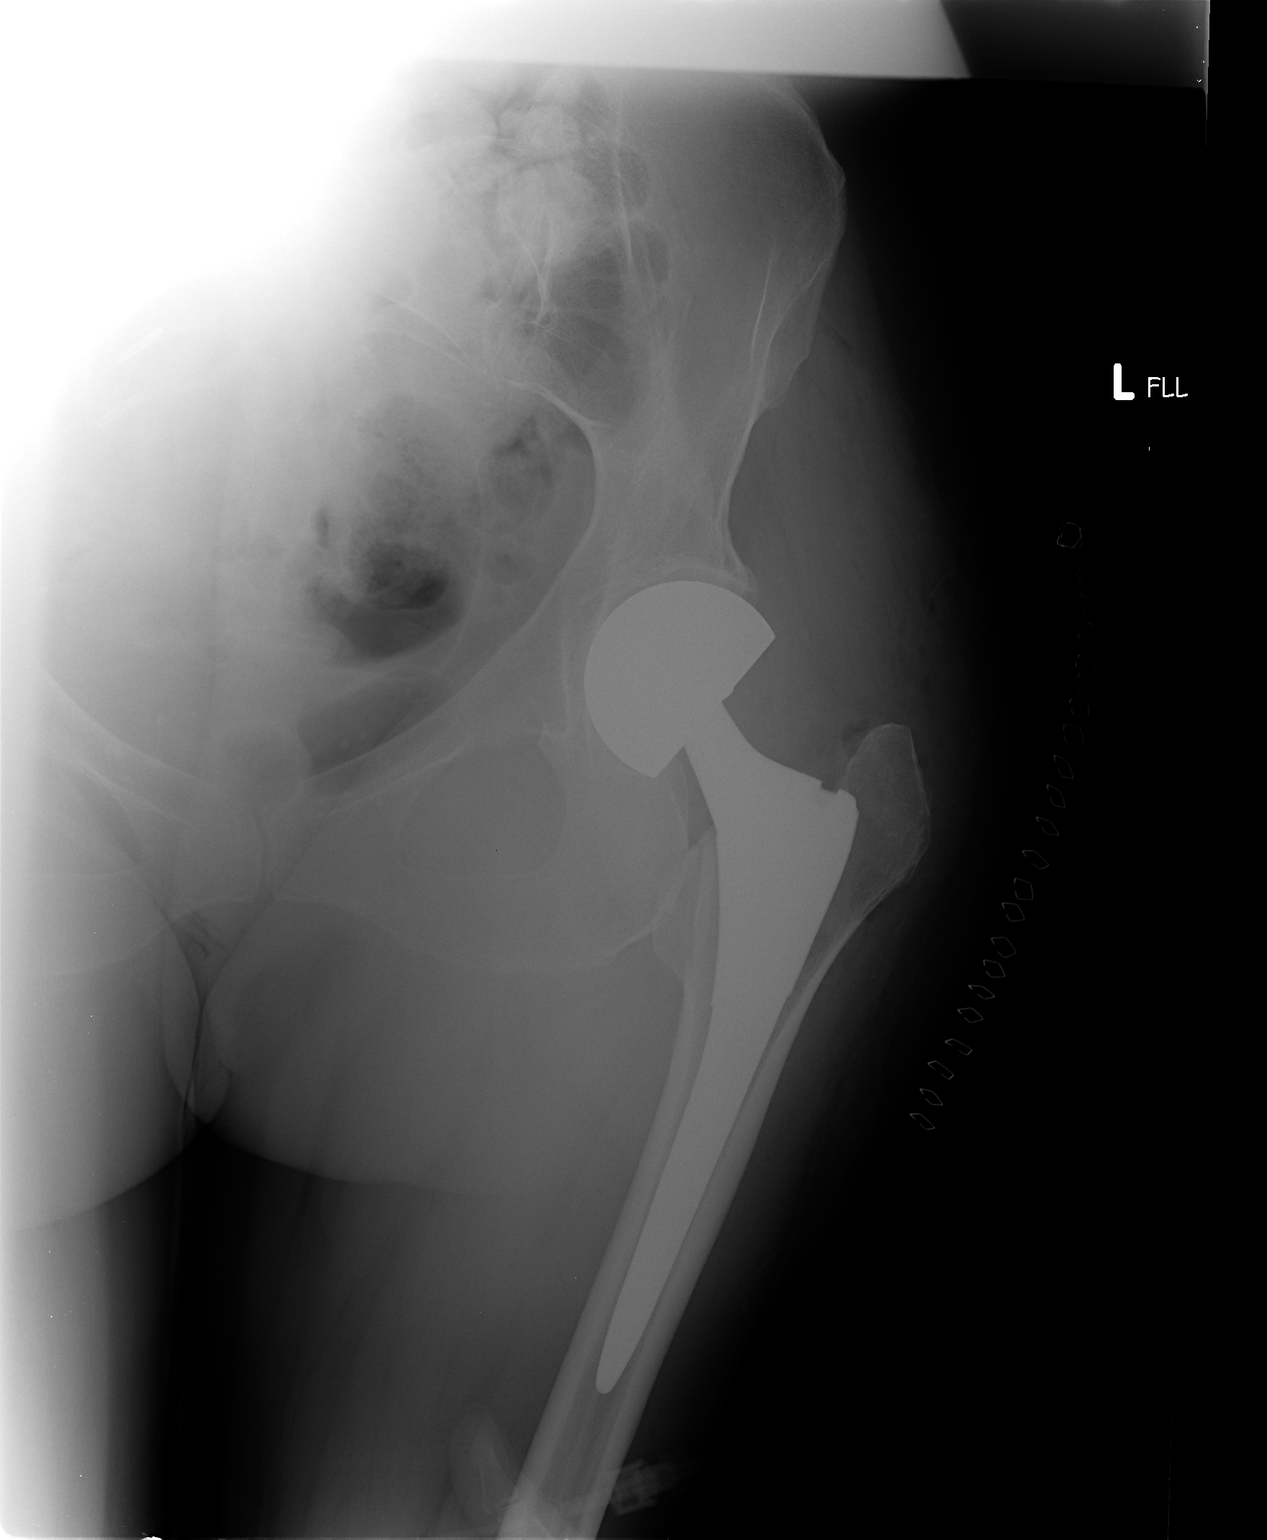

[1 of 1 positions shown; findings below may reference images not displayed]

FINDINGS: Interval resection of right femoral head and neck.
Components of right hip prosthesis are now identified.
No acute fracture, dislocation or bone destruction.
Expected overlying soft tissue changes and skin clips.
Osseous demineralization.
IMPRESSION: Right hip prosthesis without acute complication.

## 2011-09-24 ENCOUNTER — Other Ambulatory Visit: Payer: Self-pay | Admitting: *Deleted

## 2011-09-24 DIAGNOSIS — M25559 Pain in unspecified hip: Secondary | ICD-10-CM

## 2011-09-24 MED ORDER — HYDROCODONE-ACETAMINOPHEN 10-500 MG PO TABS: 1.0000 | ORAL_TABLET | ORAL | Status: DC | PRN

## 2011-09-27 IMAGING — CR DG HIP COMPLETE 2+V*R*
4 series · 4 of 4 positions shown · non-contrast
Comparison: Right hip films 08/22/2010.

CLINICAL DATA: Right hip fracture.

RIGHT HIP - COMPLETE 2+ VIEW

[view not recorded (1 of 4)]
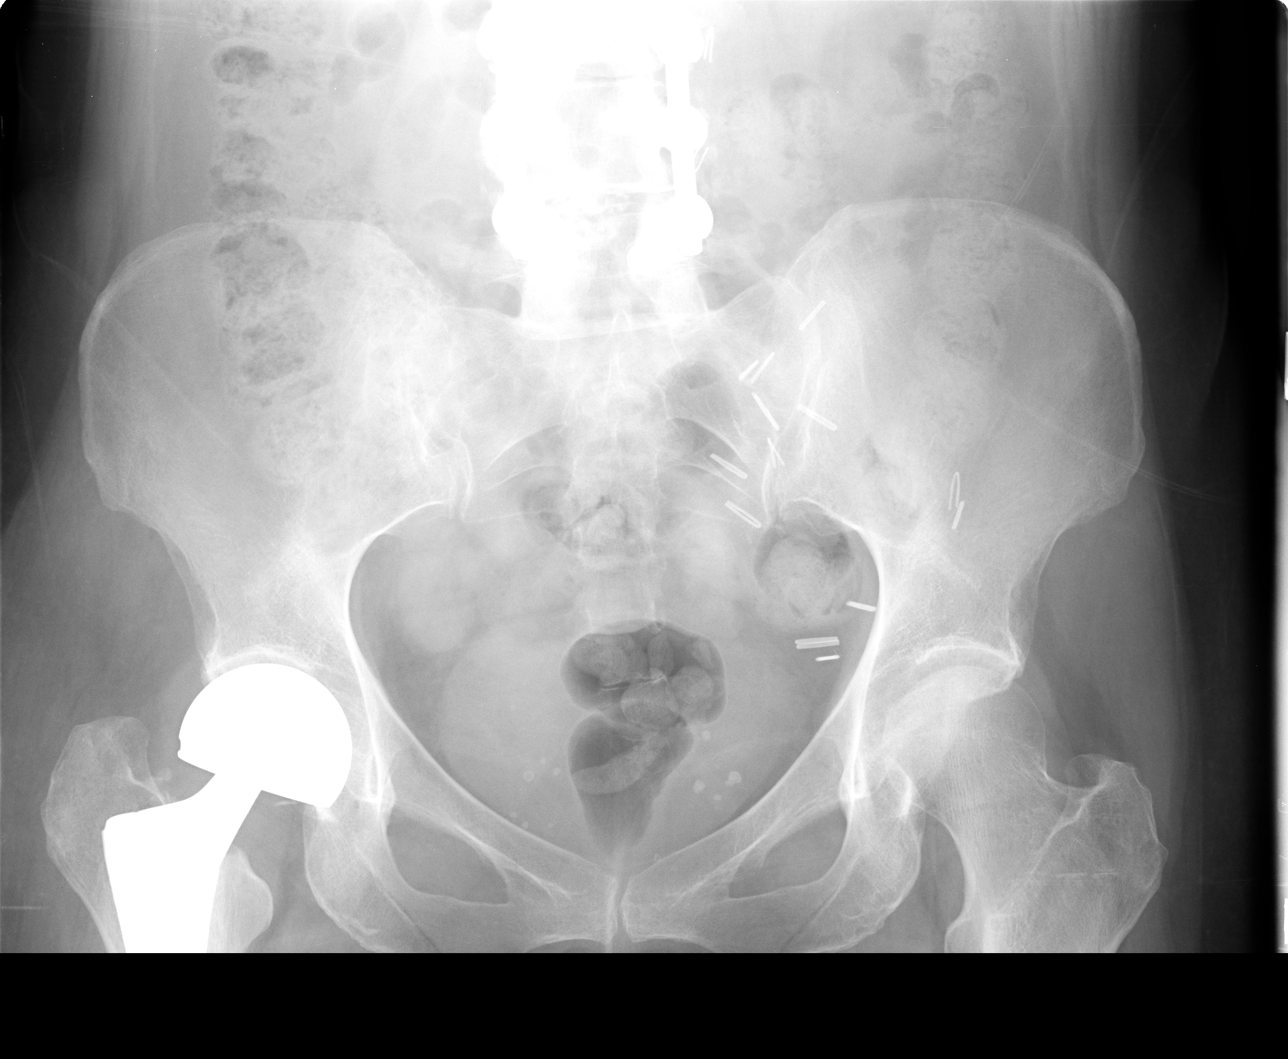

[view not recorded (2 of 4)]
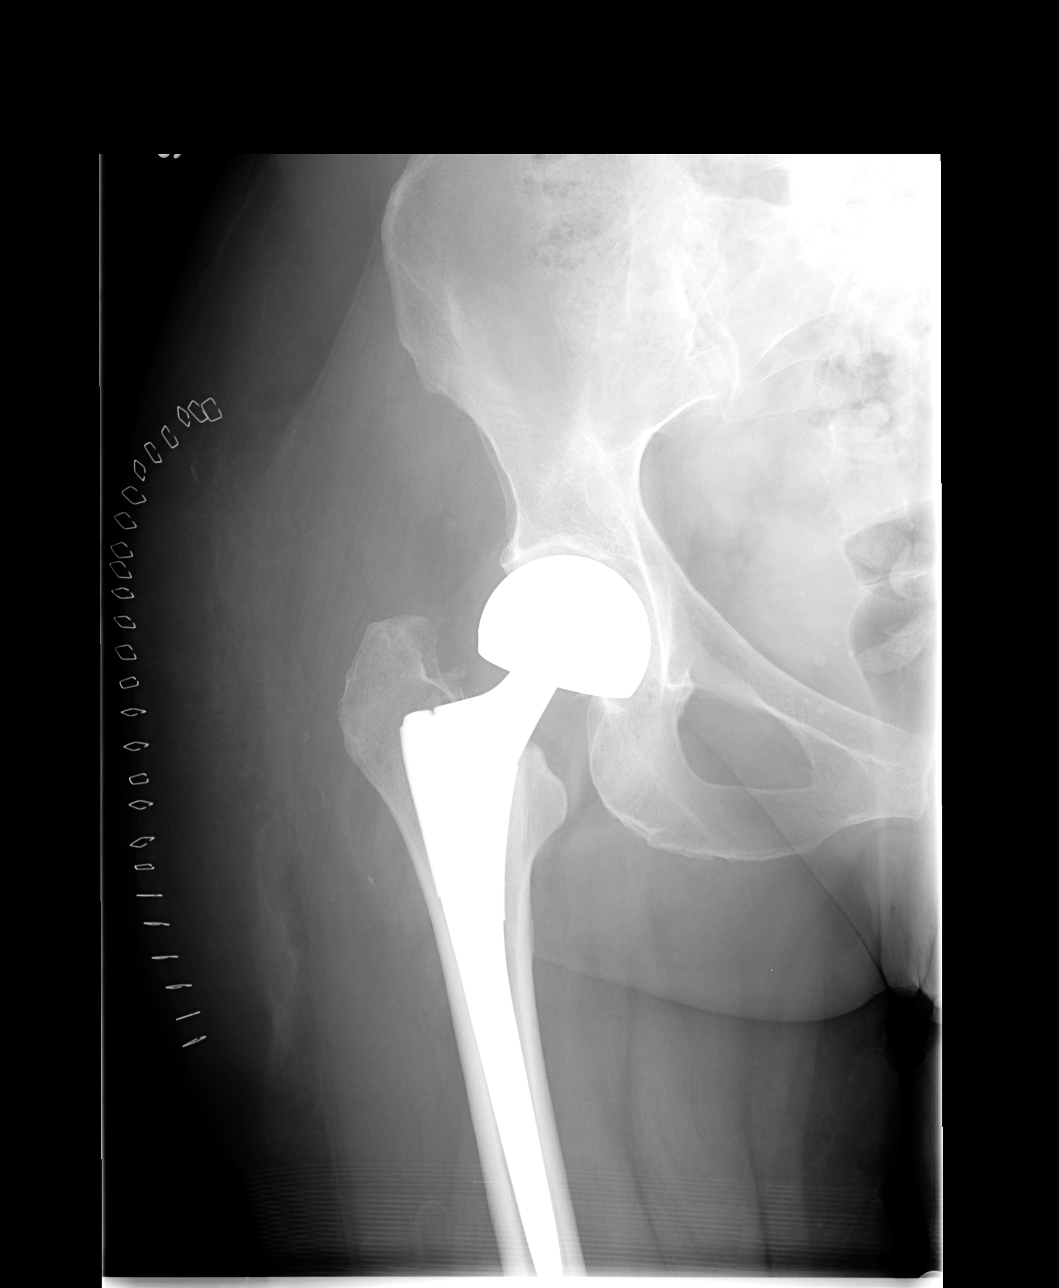

[view not recorded (3 of 4)]
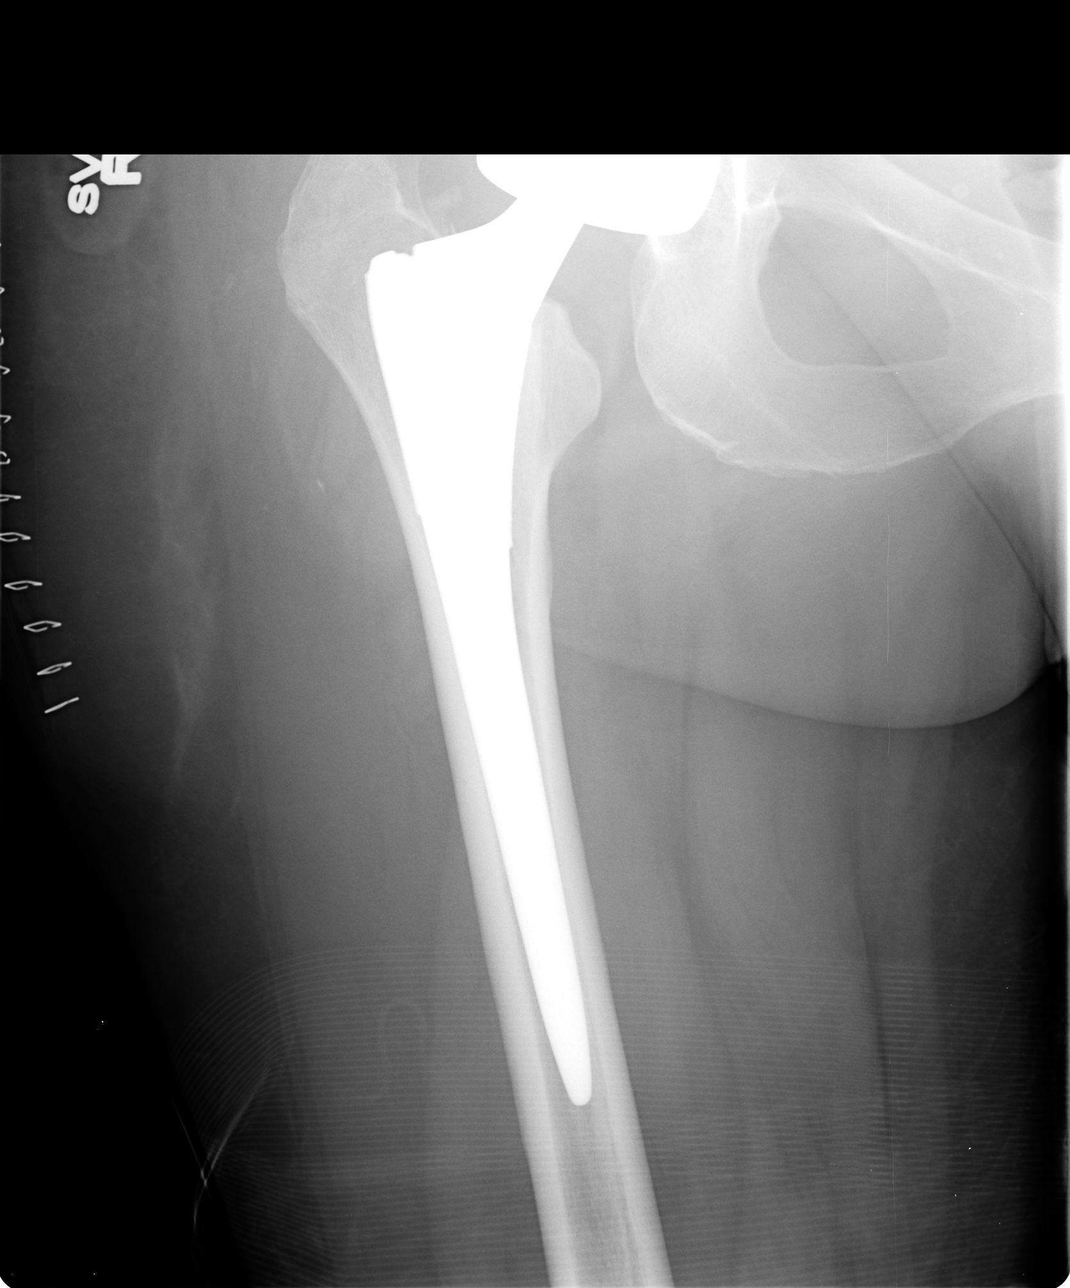

[view not recorded (4 of 4)]
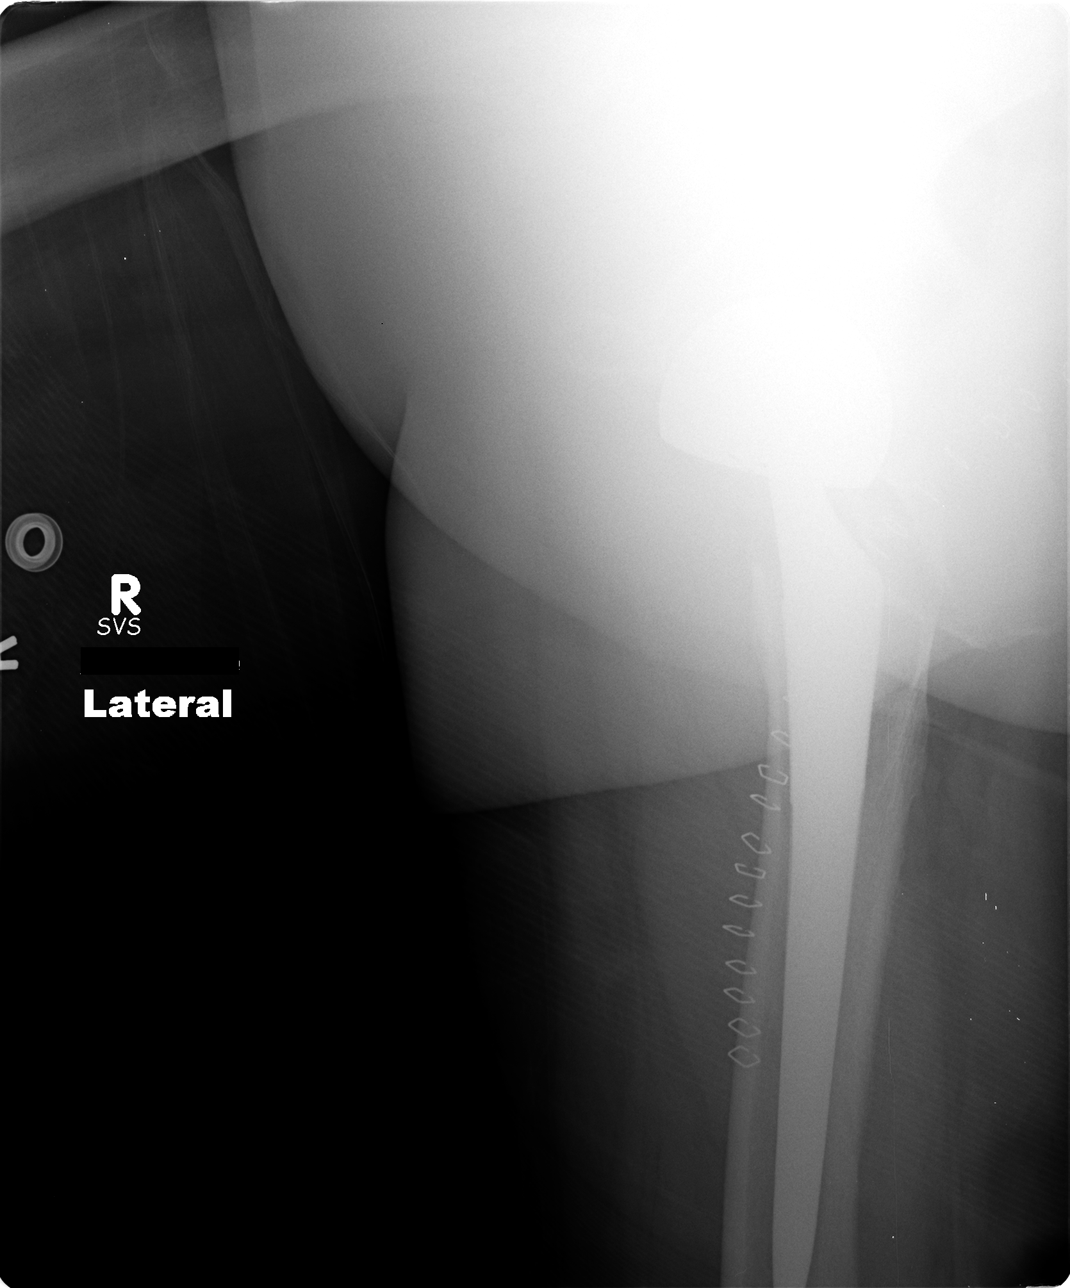

[4 of 4 positions shown; findings below may reference images not displayed]

FINDINGS: Stable position and appearance of the bipolar hip
prosthesis.  No complicating features are demonstrated.
IMPRESSION: Stable position and appearance of the right bipolar hip prosthesis.

## 2012-02-13 ENCOUNTER — Ambulatory Visit: Payer: Medicare Other | Admitting: Orthopedic Surgery

## 2012-02-19 ENCOUNTER — Ambulatory Visit (INDEPENDENT_AMBULATORY_CARE_PROVIDER_SITE_OTHER): Payer: Medicare Other | Admitting: Orthopedic Surgery

## 2012-02-19 ENCOUNTER — Encounter: Payer: Self-pay | Admitting: Orthopedic Surgery

## 2012-02-19 VITALS — BP 132/80 | Ht 67.0 in | Wt 149.0 lb

## 2012-02-19 DIAGNOSIS — Z96649 Presence of unspecified artificial hip joint: Secondary | ICD-10-CM

## 2012-02-19 DIAGNOSIS — G589 Mononeuropathy, unspecified: Secondary | ICD-10-CM

## 2012-02-19 DIAGNOSIS — S72009A Fracture of unspecified part of neck of unspecified femur, initial encounter for closed fracture: Secondary | ICD-10-CM

## 2012-02-19 NOTE — Patient Instructions (Addendum)
You have received a steroid shot. 15% of patients experience increased pain at the injection site with in the next 24 hours. This is best treated with ice and tylenol extra strength 2 tabs every 8 hours. If you are still having pain please call the office.   Call for refill when needed

## 2012-02-20 ENCOUNTER — Encounter: Payer: Self-pay | Admitting: Orthopedic Surgery

## 2012-02-20 DIAGNOSIS — Z96649 Presence of unspecified artificial hip joint: Secondary | ICD-10-CM | POA: Insufficient documentation

## 2012-02-20 HISTORY — DX: Presence of unspecified artificial hip joint: Z96.649

## 2012-02-20 NOTE — Progress Notes (Signed)
Patient ID: Victoria Bailey, female   DOB: January 10, 1956, 56 y.o.   MRN: [id number] Chief Complaint  Patient presents with  . Follow-up    6 month re eval right hip    BP 132/80  Ht 5\' 7"  (1.702 m)  Wt 149 lb (67.586 kg)  BMI 23.34 kg/m2  RIGHT bipolar hip replacement on August 22, 2010  Routine followup.  Complains of pain in her lumbar spine in heel area radiating to her right hip. Shows some mild tenderness over the greater trochanter status post 2 or 3 injections into the trochanteric bursa.  She also wanted her left knee checked.  She has a nodule there.  Review of systems she's had some difficulties with her jaw and is being followed at [facility] she's had a CAT scan it was normal she is to see that doctor again for followup  Exam well-developed well-nourished female grooming and hygiene are intact she is oriented x3 has a normal mood she embolus with a slight limp and a slowed gait. Inspection reveals tenderness over the greater trochanter mild severe tenderness over the lumbar spine on the right side along the gluteal area radiating into the right hip range of motion of the hip no pain full range of motion is noted. She does have a slight leg length discrepancy on the right 5 mm shortening of the right lower extremity although clinically when standing she feels more and has a lift in the right shoe. Lumbar disc disease is contributing. The skin incision is nontender.  Impression status post bipolar hip replacement right lower extremity                   Lumbar spine disease chronic with probable pain generating from the cluneal nerves  Plan injected the area of maximal tenderness which was not I repeat not the greater trochanter  She is to followup in 6 months she can call for refills of her hydrocodone

## 2012-06-17 ENCOUNTER — Other Ambulatory Visit: Payer: Self-pay | Admitting: *Deleted

## 2012-06-17 MED ORDER — HYDROCODONE-ACETAMINOPHEN 10-325 MG PO TABS: 1.0000 | ORAL_TABLET | ORAL | Status: DC | PRN

## 2012-06-22 ENCOUNTER — Other Ambulatory Visit: Payer: Self-pay | Admitting: *Deleted

## 2012-06-22 MED ORDER — HYDROCODONE-ACETAMINOPHEN 10-325 MG PO TABS: 1.0000 | ORAL_TABLET | ORAL | Status: AC | PRN

## 2012-08-19 ENCOUNTER — Ambulatory Visit (INDEPENDENT_AMBULATORY_CARE_PROVIDER_SITE_OTHER): Payer: Medicare Other | Admitting: Orthopedic Surgery

## 2012-08-19 ENCOUNTER — Encounter: Payer: Self-pay | Admitting: Orthopedic Surgery

## 2012-08-19 VITALS — BP 122/78 | Ht 67.0 in

## 2012-08-19 DIAGNOSIS — Z96649 Presence of unspecified artificial hip joint: Secondary | ICD-10-CM

## 2012-08-19 NOTE — Patient Instructions (Signed)
Change to a 7/8 inch insert

## 2012-08-19 NOTE — Progress Notes (Signed)
Patient ID: Victoria Bailey, female   DOB: 02/19/1956, 56 y.o.   MRN: [id number] Chief Complaint  Patient presents with  . Follow-up    recheck Right hip, DOS 08/22/10 bipolar replacement    C/o pain in c spine   Hip improving   Review of systems neck pain right upper extremity radicular pain  The patient like her left elevated just a little bit more we did a block test it looks like she levels are evident and so we gave her a 7/8 inch prescription for left  Follow up one year

## 2013-03-29 ENCOUNTER — Encounter (HOSPITAL_COMMUNITY): Payer: Self-pay | Admitting: *Deleted

## 2013-03-29 ENCOUNTER — Emergency Department (HOSPITAL_COMMUNITY): Payer: Medicare Other

## 2013-03-29 ENCOUNTER — Emergency Department (HOSPITAL_COMMUNITY)
Admission: EM | Admit: 2013-03-29 | Discharge: 2013-03-29 | Disposition: A | Discharge status: Home / Self Care | Payer: Medicare Other | Attending: Emergency Medicine | Admitting: Emergency Medicine

## 2013-03-29 DIAGNOSIS — X503XXA Overexertion from repetitive movements, initial encounter: Secondary | ICD-10-CM | POA: Insufficient documentation

## 2013-03-29 DIAGNOSIS — Z79899 Other long term (current) drug therapy: Secondary | ICD-10-CM | POA: Insufficient documentation

## 2013-03-29 DIAGNOSIS — S92351A Displaced fracture of fifth metatarsal bone, right foot, initial encounter for closed fracture: Secondary | ICD-10-CM

## 2013-03-29 DIAGNOSIS — S92309A Fracture of unspecified metatarsal bone(s), unspecified foot, initial encounter for closed fracture: Secondary | ICD-10-CM | POA: Insufficient documentation

## 2013-03-29 DIAGNOSIS — Y9301 Activity, walking, marching and hiking: Secondary | ICD-10-CM | POA: Insufficient documentation

## 2013-03-29 DIAGNOSIS — Z91040 Latex allergy status: Secondary | ICD-10-CM | POA: Insufficient documentation

## 2013-03-29 DIAGNOSIS — G43909 Migraine, unspecified, not intractable, without status migrainosus: Secondary | ICD-10-CM | POA: Insufficient documentation

## 2013-03-29 DIAGNOSIS — Y92009 Unspecified place in unspecified non-institutional (private) residence as the place of occurrence of the external cause: Secondary | ICD-10-CM | POA: Insufficient documentation

## 2013-03-29 DIAGNOSIS — M810 Age-related osteoporosis without current pathological fracture: Secondary | ICD-10-CM | POA: Insufficient documentation

## 2013-03-29 HISTORY — DX: Age-related osteoporosis without current pathological fracture: M81.0

## 2013-03-29 HISTORY — DX: Migraine, unspecified, not intractable, without status migrainosus: G43.909

## 2013-03-29 HISTORY — DX: Foot drop, unspecified foot: M21.379

## 2013-03-29 NOTE — ED Notes (Signed)
Instructions reviewed and f/u information provided-verbalizes understanding.   

## 2013-03-29 NOTE — ED Notes (Signed)
Pain , swelling to  Rt foot, injury on Saturday

## 2013-03-29 NOTE — ED Provider Notes (Signed)
History  This chart was scribed for Donnetta Hutching, MD by Ardelia Mems, ED Scribe. This patient was seen in room APFT24/APFT24 and the patient's care was started at 5:28 PM.   CSN: [id number]  Arrival date & time 03/29/13  1349    Chief Complaint  Patient presents with  . Foot Pain    The history is provided by the patient. No language interpreter was used.    HPI Comments: Victoria Bailey is a 57 y.o. female who presents to the Emergency Department complaining of constant, moderate lateral and palmar right foot pain onset 2 days ago. There is associated swelling and bruising of the right foot. Pt states that she has a h/o foot drop and osteoporosis and that she injured the foot while walking in her home 2 days ago. Pt denies alcohol use and smoking. Pt denies recent falls, any other injuries or symptoms.   Past Medical History  Diagnosis Date  . 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 Foot drop   . Osteoporosis   . Migraine     Past Surgical History  Procedure Laterality Date  . Hip surgery      rt bipolar 10.26.2011  . Joint replacement    . Back surgery    . Nephrectomy    . Abdominal hysterectomy      History reviewed. No pertinent family history.  History  Substance Use Topics  . Smoking status: Never Smoker   . Smokeless tobacco: Not on file  . Alcohol Use: No    OB History   Grav Para Term Preterm Abortions TAB SAB Ect Mult Living                  Review of Systems  Musculoskeletal:       Right foot pain.  All other systems reviewed and are negative.    Allergies  Latex; Oxycodone hcl er; Sulfa drugs cross reactors; and Doxycycline  Home Medications   Current Outpatient Rx  Name  Route  Sig  Dispense  Refill  . baclofen (LIORESAL) 10 MG tablet   Oral   Take 10 mg by mouth 3 (three) times daily as needed.          Marland Kitchen buPROPion (WELLBUTRIN SR) 200 MG 12 hr tablet   Oral   Take 200 mg by mouth daily.          . calcium carbonate  (OS-CAL) 600 MG TABS   Oral   Take 600 mg by mouth 2 (two) times daily.         . Cholecalciferol (VITAMIN D) 2000 UNITS CAPS   Oral   Take 1 capsule by mouth daily.         Marland Kitchen estrogens, conjugated, (PREMARIN) 0.625 MG tablet   Oral   Take 0.625 mg by mouth daily.          . fentaNYL (DURAGESIC - DOSED MCG/HR) 50 MCG/HR   Transdermal   Place 1 patch onto the skin every 3 (three) days.           Marland Kitchen HYDROcodone-acetaminophen (NORCO) 10-325 MG per tablet   Oral   Take 1 tablet by mouth every 6 (six) hours as needed for pain.         Marland Kitchen lisinopril-hydrochlorothiazide (PRINZIDE,ZESTORETIC) 20-12.5 MG per tablet   Oral   Take 1 tablet by mouth daily.         Marland Kitchen LORazepam (ATIVAN) 0.5 MG tablet   Oral  Take 0.5 mg by mouth every 6 (six) hours as needed.          Marland Kitchen PARoxetine (PAXIL) 30 MG tablet   Oral   Take 30 mg by mouth every morning.         . SUMAtriptan (IMITREX) 100 MG tablet   Oral   Take 100 mg by mouth every 2 (two) hours as needed for migraine.          . topiramate (TOPAMAX) 100 MG tablet   Oral   Take 100 mg by mouth daily.           Triage Vitals: BP 138/82  Pulse 63  Temp(Src) 98.2 F (36.8 C) (Oral)  Resp 20  Ht 5\' 7"  (1.702 m)  Wt 147 lb (66.679 kg)  BMI 23.02 kg/m2  SpO2 100%  Physical Exam  Nursing note and vitals reviewed. Constitutional: She is oriented to person, place, and time. She appears well-developed and well-nourished.  HENT:  Head: Normocephalic.  Musculoskeletal:  Tenderness to palpation of the lateral and palmar aspects of the right foot. Ecchymosis of entire lateral aspect of right foot.   Neurological: She is alert and oriented to person, place, and time.  Skin: Skin is warm. No rash noted.    ED Course  Procedures (including critical care time)  DIAGNOSTIC STUDIES: Oxygen Saturation is 100% on RA, norma by my interpretation.    COORDINATION OF CARE: 5:34 PM- Pt informed of distal fifth metatarsal  shaft fracture and both conservative and aggressive options. Pt is agreeable to treating with ice, elevation and wearing proper shoes. Pt also agreeable to follow-up with orthopaedist.      Labs Reviewed - No data to display Dg Foot Complete Right  03/29/2013   *RADIOLOGY REPORT*  Clinical Data: Injured right foot.  RIGHT FOOT COMPLETE - 3+ VIEW  Comparison: None  Findings: There is a comminuted and mildly displaced fracture involving the distal fifth metatarsal shaft.  No involvement of the joint.  The other metatarsals are intact.  The joint spaces are maintained.  Mild degenerative changes noted at the first metatarsal phalangeal joint.  A moderate sized calcaneal heel spur is noted.  IMPRESSION: Comminuted and mildly displaced distal fifth metatarsal shaft fracture.   Original Report Authenticated By: Rudie Meyer, M.D.     No diagnosis found.    MDM  X-rays of right foot reveal a comminuted mild displaced distal fifth metatarsal shaft fracture.   Patient will be placed in a wooden shoe.   Refer to orthopedics.  She understands the possibility of surgical intervention.        I personally performed the services described in this documentation, which was scribed in my presence. The recorded information has been reviewed and is accurate.       Donnetta Hutching, MD 04/03/13 [id number]

## 2013-08-25 ENCOUNTER — Ambulatory Visit: Payer: Medicare Other | Admitting: Orthopedic Surgery

## 2013-09-02 ENCOUNTER — Ambulatory Visit: Payer: Medicare Other | Admitting: Orthopedic Surgery

## 2013-10-05 ENCOUNTER — Ambulatory Visit: Payer: BC Managed Care – PPO | Admitting: Orthopedic Surgery

## 2013-11-11 ENCOUNTER — Ambulatory Visit (INDEPENDENT_AMBULATORY_CARE_PROVIDER_SITE_OTHER): Payer: Medicare Other | Admitting: Orthopedic Surgery

## 2013-11-11 ENCOUNTER — Ambulatory Visit (INDEPENDENT_AMBULATORY_CARE_PROVIDER_SITE_OTHER): Payer: Medicare Other

## 2013-11-11 VITALS — BP 127/72 | Ht 67.0 in | Wt 156.0 lb

## 2013-11-11 DIAGNOSIS — Z96641 Presence of right artificial hip joint: Secondary | ICD-10-CM

## 2013-11-11 DIAGNOSIS — Z96649 Presence of unspecified artificial hip joint: Secondary | ICD-10-CM

## 2013-11-11 DIAGNOSIS — Z471 Aftercare following joint replacement surgery: Secondary | ICD-10-CM

## 2013-11-11 DIAGNOSIS — M79609 Pain in unspecified limb: Secondary | ICD-10-CM

## 2013-11-11 NOTE — Progress Notes (Signed)
Subjective:     Patient ID: Victoria Bailey, female   DOB: 1956-03-27, 58 y.o.   MRN: [id number]  Chief Complaint  Patient presents with  . Follow-up    Yearly recheck on right hip bipolar. DOS 08-22-10.    HPI No new complaints   Concerned about her neck "spurs" and headaches   Review of Systems Occasional arm pain     Objective:   Physical Exam Right leg short block test 3/4 inch  BP 127/72  Ht 5\' 7"  (1.702 m)  Wt 156 lb (70.761 kg)  BMI 24.43 kg/m2 General appearance is normal, the patient is alert and oriented x3 with normal mood and affect. Hip rom right normal     Assessment:     Encounter Diagnosis  Name Primary?  . Status post right hip replacement Yes        Plan:     1 year xrays

## 2014-08-11 ENCOUNTER — Ambulatory Visit: Payer: Medicare Other | Admitting: Orthopedic Surgery

## 2014-08-19 ENCOUNTER — Other Ambulatory Visit: Payer: Self-pay | Admitting: Orthopedic Surgery

## 2014-08-19 DIAGNOSIS — M25551 Pain in right hip: Secondary | ICD-10-CM

## 2014-08-22 ENCOUNTER — Ambulatory Visit: Payer: Medicare Other | Admitting: Orthopedic Surgery

## 2014-08-22 ENCOUNTER — Ambulatory Visit (HOSPITAL_COMMUNITY)
Admission: RE | Admit: 2014-08-22 | Discharge: 2014-08-22 | Disposition: A | Discharge status: Home / Self Care | Payer: Medicare Other | Source: Ambulatory Visit | Attending: Orthopedic Surgery | Admitting: Orthopedic Surgery

## 2014-08-22 DIAGNOSIS — Z96641 Presence of right artificial hip joint: Secondary | ICD-10-CM | POA: Insufficient documentation

## 2014-08-22 DIAGNOSIS — M25551 Pain in right hip: Secondary | ICD-10-CM

## 2014-09-12 ENCOUNTER — Encounter: Payer: Self-pay | Admitting: Orthopedic Surgery

## 2014-09-12 ENCOUNTER — Ambulatory Visit (INDEPENDENT_AMBULATORY_CARE_PROVIDER_SITE_OTHER): Payer: Medicare Other | Admitting: Orthopedic Surgery

## 2014-09-12 VITALS — BP 110/62 | Ht 67.0 in | Wt 152.0 lb

## 2014-09-12 DIAGNOSIS — Z96641 Presence of right artificial hip joint: Secondary | ICD-10-CM | POA: Insufficient documentation

## 2014-09-12 DIAGNOSIS — M76891 Other specified enthesopathies of right lower limb, excluding foot: Secondary | ICD-10-CM

## 2014-09-12 DIAGNOSIS — Z9660 Presence of unspecified orthopedic joint implant: Secondary | ICD-10-CM

## 2014-09-12 DIAGNOSIS — M65851 Other synovitis and tenosynovitis, right thigh: Secondary | ICD-10-CM

## 2014-09-12 DIAGNOSIS — G894 Chronic pain syndrome: Secondary | ICD-10-CM | POA: Insufficient documentation

## 2014-09-12 DIAGNOSIS — Z981 Arthrodesis status: Secondary | ICD-10-CM | POA: Insufficient documentation

## 2014-09-12 MED ORDER — PREDNISONE (PAK) 5 MG PO TABS: ORAL_TABLET | ORAL | Status: DC

## 2014-09-12 NOTE — Progress Notes (Signed)
Patient ID: Victoria Bailey, female   DOB: 07-03-1956, 58 y.o.   MRN: [id number]  Chief Complaint  Patient presents with  . Follow-up    9 month recheck Right hip//surgery date was 08/22/2010    Follow-up visit status post bipolar replacement for right hip fracture with underlying disease of spinal fusion history and chronic pain.  Currently under pain management.  Recently some of her medications were reduced or stopped and she complains of anterior hip pain radiating to the groin and posteriorly to the lumbar spine associated with difficulty lifting her leg.  Review of systems no bowel or bladder dysfunction she is having some lumbar-related pain as well.  Past Medical History  Diagnosis Date  . 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 Foot drop   . Osteoporosis   . Migraine      BP 110/62 mmHg  Ht 5\' 7"  (1.702 m)  Wt 152 lb (68.947 kg)  BMI 23.80 kg/m2 She is awake alert and oriented 3 mood and affect are normal. She has tenderness over the right hip flexors with painless rotation of the hip internal/external she does have some shortening of the right leg her hip remains stable she has weakness of the hip flexors skin incision clean dry and intact she has some mild back tenderness as well. She is ambulatory with a lift inside the shoe.in comparison the left hip is nontender has full range of motion there is no instability motor exam is normal  She has bilateral distal pulses    X-ray show stable hip prosthesis bipolar  Encounter Diagnoses  Name Primary?  . Status post right hip replacement   . Tendinitis of right hip flexor Yes  . H/O spinal fusion   . 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 We are 4 years postop at this point with no x-ray problems noted.

## 2014-10-10 ENCOUNTER — Encounter: Payer: Self-pay | Admitting: Orthopedic Surgery

## 2014-10-10 ENCOUNTER — Ambulatory Visit (INDEPENDENT_AMBULATORY_CARE_PROVIDER_SITE_OTHER): Payer: Medicare Other | Admitting: Orthopedic Surgery

## 2014-10-10 VITALS — BP 125/79 | Ht 67.0 in | Wt 152.0 lb

## 2014-10-10 DIAGNOSIS — M65851 Other synovitis and tenosynovitis, right thigh: Secondary | ICD-10-CM

## 2014-10-10 DIAGNOSIS — Z96641 Presence of right artificial hip joint: Secondary | ICD-10-CM

## 2014-10-10 DIAGNOSIS — M76891 Other specified enthesopathies of right lower limb, excluding foot: Secondary | ICD-10-CM

## 2014-10-10 DIAGNOSIS — Z981 Arthrodesis status: Secondary | ICD-10-CM

## 2014-10-10 DIAGNOSIS — Z9660 Presence of unspecified orthopedic joint implant: Secondary | ICD-10-CM

## 2014-10-10 DIAGNOSIS — G894 Chronic pain syndrome: Secondary | ICD-10-CM

## 2014-10-10 MED ORDER — PREDNISONE (PAK) 5 MG PO TABS: ORAL_TABLET | ORAL | Status: DC

## 2014-10-10 NOTE — Progress Notes (Signed)
Follow-up visit  Encounter Diagnoses  Name Primary?  . Tendinitis of right hip flexor Yes  . Status post right hip replacement   . H/O spinal fusion   . Chronic pain syndrome     Chief Complaint  Patient presents with  . Follow-up    2 week recheck back/right hip response to meds   BP 125/79 mmHg  Ht 5\' 7"  (1.702 m)  Wt 152 lb (68.947 kg)  BMI 23.80 kg/m2   The patient was placed on steroids for a tendinitis and weakness in her right hip. She took 1 10 mg 12 day Dosepak. With good result in terms of her ability to lift her leg her lateral hip pain. The groin pain she's had since her surgery.   Review of systems no new findings on the musculoskeletal portion  examination vital signs are stable as above she is awake alert and oriented 3 mood is normal appearance is normal gait is unsupported slight alteration in gait due to leg length discrepancy. Hip range of motion has improved back to normal hip remains stable.  Imaging none today  improved tendinitis of the right hip flexor   recommend 5 mg of prednisone daily for 30 days in a four-week follow-up.

## 2014-11-15 ENCOUNTER — Ambulatory Visit: Payer: Medicare Other | Admitting: Orthopedic Surgery

## 2014-12-12 ENCOUNTER — Ambulatory Visit: Payer: Medicare Other | Admitting: Orthopedic Surgery

## 2014-12-27 ENCOUNTER — Ambulatory Visit (INDEPENDENT_AMBULATORY_CARE_PROVIDER_SITE_OTHER): Payer: Medicare Other | Admitting: Orthopedic Surgery

## 2014-12-27 VITALS — BP 143/80 | Ht 67.0 in | Wt 152.0 lb

## 2014-12-27 DIAGNOSIS — Z981 Arthrodesis status: Secondary | ICD-10-CM

## 2014-12-27 DIAGNOSIS — M76891 Other specified enthesopathies of right lower limb, excluding foot: Secondary | ICD-10-CM

## 2014-12-27 DIAGNOSIS — G894 Chronic pain syndrome: Secondary | ICD-10-CM

## 2014-12-27 DIAGNOSIS — Z9660 Presence of unspecified orthopedic joint implant: Secondary | ICD-10-CM

## 2014-12-27 DIAGNOSIS — M65851 Other synovitis and tenosynovitis, right thigh: Secondary | ICD-10-CM

## 2014-12-27 DIAGNOSIS — Z96641 Presence of right artificial hip joint: Secondary | ICD-10-CM

## 2014-12-27 NOTE — Progress Notes (Signed)
Follow-up patient status post bipolar hip replacement for right hip fracture several years ago. She presented with hip flexor tendinitis so we thought however after 5 mg of prednisone for 30 days which did help she was placed on a new medicine for chronic neurogenic pain and that seems to have relief most of her symptoms. Her pain starts in her lower back radiates around her hip into her groin on the right. The new medication seems to be helping significantly and we will continue with that. Her hip flexion is pain-free she has normal strength in the hip flexor at this time.  Follow-up with [place] in 3 months

## 2015-03-30 ENCOUNTER — Ambulatory Visit: Payer: Medicare Other | Admitting: Orthopedic Surgery

## 2015-04-17 ENCOUNTER — Ambulatory Visit: Payer: Medicare Other | Admitting: Orthopedic Surgery

## 2015-04-18 ENCOUNTER — Ambulatory Visit (HOSPITAL_COMMUNITY): Payer: Medicare Other | Admitting: Psychology

## 2015-04-19 ENCOUNTER — Ambulatory Visit (INDEPENDENT_AMBULATORY_CARE_PROVIDER_SITE_OTHER): Payer: Medicare Other | Admitting: Psychology

## 2015-04-19 DIAGNOSIS — F39 Unspecified mood [affective] disorder: Secondary | ICD-10-CM | POA: Diagnosis not present

## 2015-04-19 DIAGNOSIS — F0630 Mood disorder due to known physiological condition, unspecified: Secondary | ICD-10-CM

## 2015-04-19 DIAGNOSIS — F332 Major depressive disorder, recurrent severe without psychotic features: Secondary | ICD-10-CM

## 2015-05-02 ENCOUNTER — Ambulatory Visit (INDEPENDENT_AMBULATORY_CARE_PROVIDER_SITE_OTHER): Payer: Medicare Other | Admitting: Orthopedic Surgery

## 2015-05-02 ENCOUNTER — Encounter: Payer: Self-pay | Admitting: Orthopedic Surgery

## 2015-05-02 VITALS — BP 109/63 | Ht 67.0 in | Wt 154.0 lb

## 2015-05-02 DIAGNOSIS — M7072 Other bursitis of hip, left hip: Secondary | ICD-10-CM

## 2015-05-02 NOTE — Patient Instructions (Signed)
Joint Injection  Care After  Refer to this sheet in the next few days. These instructions provide you with information on caring for yourself after you have had a joint injection. Your caregiver also may give you more specific instructions. Your treatment has been planned according to current medical practices, but problems sometimes occur. Call your caregiver if you have any problems or questions after your procedure.  After any type of joint injection, it is not uncommon to experience:  · Soreness, swelling, or bruising around the injection site.  · Mild numbness, tingling, or weakness around the injection site caused by the numbing medicine used before or with the injection.  It also is possible to experience the following effects associated with the specific agent after injection:  · Iodine-based contrast agents:  ¨ Allergic reaction (itching, hives, widespread redness, and swelling beyond the injection site).  · Corticosteroids (These effects are rare.):  ¨ Allergic reaction.  ¨ Increased blood sugar levels (If you have diabetes and you notice that your blood sugar levels have increased, notify your caregiver).  ¨ Increased blood pressure levels.  ¨ Mood swings.  · Hyaluronic acid in the use of viscosupplementation.  ¨ Temporary heat or redness.  ¨ Temporary rash and itching.  ¨ Increased fluid accumulation in the injected joint.  These effects all should resolve within a day after your procedure.   HOME CARE INSTRUCTIONS  · Limit yourself to light activity the day of your procedure. Avoid lifting heavy objects, bending, stooping, or twisting.  · Take prescription or over-the-counter pain medication as directed by your caregiver.  · You may apply ice to your injection site to reduce pain and swelling the day of your procedure. Ice may be applied 03-04 times:  ¨ Put ice in a plastic bag.  ¨ Place a towel between your skin and the bag.  ¨ Leave the ice on for no longer than 15-20 minutes each time.  SEEK  IMMEDIATE MEDICAL CARE IF:   · Pain and swelling get worse rather than better or extend beyond the injection site.  · Numbness does not go away.  · Blood or fluid continues to leak from the injection site.  · You have chest pain.  · You have swelling of your face or tongue.  · You have trouble breathing or you become dizzy.  · You develop a fever, chills, or severe tenderness at the injection site that last longer than 1 day.  MAKE SURE YOU:  · Understand these instructions.  · Watch your condition.  · Get help right away if you are not doing well or if you get worse.  Document Released: 06/27/2011 Document Revised: 01/06/2012 Document Reviewed: 06/27/2011  ExitCare® Patient Information ©2015 [employer]. This information is not intended to replace advice given to you by your health care provider. Make sure you discuss any questions you have with your health care provider.

## 2015-05-02 NOTE — Progress Notes (Signed)
Patient ID: Victoria Bailey, female   DOB: 01/01/1956, 59 y.o.   MRN: [id number]  Follow up visit  Chief Complaint  Patient presents with  . Follow-up    3 month follow up hip/back    BP 109/63 mmHg  Ht 5\' 7"  (1.702 m)  Wt 154 lb (69.854 kg)  BMI 24.11 kg/m2  Encounter Diagnosis  Name Primary?  . Bursitis of left hip Yes    Status post right bipolar hip replacement, chronic pain syndrome, chronic back pain, presents for reevaluation of her right hip  Tenderness is noted and pain is noted over the right greater trochanteric bursa previous injections did help  The patient is not complaining of any new findings on review of systems  Her vital signs are stable she is awake and alert she is oriented 3 mood and affect are normal she has tenderness over the greater trochanter painless range of motion in the right hip tenderness in the right hip flexor. Hip stable leg lengths are unequal right leg shorter motor exam normal skin intact  Bursitis right hip  Injected right hip  Procedure note injection bursitis right hip  Medications lidocaine 1% 3 mL. 40 mg Depo-Medrol 1 mL.  Details: Timeout to confirm procedure verbal consent. 20-gauge needle used to inject right great trochanteric bursa no complications  Alcohol and ethyl chloride was also used to prep the skin  Follow-up 3 months

## 2015-05-16 ENCOUNTER — Telehealth (HOSPITAL_COMMUNITY): Payer: Self-pay | Admitting: *Deleted

## 2015-05-16 NOTE — Telephone Encounter (Signed)
Pt called stating she needs to have her last visit notes with Dr. Sima Matas to be faxed to her insurance so they will pay for her visits. [employer] attention Kipp Laurence at [phone].

## 2015-05-18 ENCOUNTER — Ambulatory Visit (HOSPITAL_COMMUNITY): Payer: Self-pay | Admitting: Psychology

## 2015-05-19 ENCOUNTER — Encounter (HOSPITAL_COMMUNITY): Payer: Self-pay | Admitting: Psychology

## 2015-08-03 ENCOUNTER — Ambulatory Visit (INDEPENDENT_AMBULATORY_CARE_PROVIDER_SITE_OTHER): Payer: Medicare Other | Admitting: Orthopedic Surgery

## 2015-08-03 VITALS — BP 136/78 | Ht 67.0 in | Wt 159.0 lb

## 2015-08-03 DIAGNOSIS — M25551 Pain in right hip: Secondary | ICD-10-CM

## 2015-08-03 NOTE — Progress Notes (Signed)
Patient ID: Victoria Bailey, female   DOB: 10/30/1955, 59 y.o.   MRN: [id number]  Follow up visit  Chief Complaint  Patient presents with  . Follow-up    3 month follow up back/hip, requests repeat hip injection    BP 136/78 mmHg  Ht 5\' 7"  (1.702 m)  Wt 159 lb (72.122 kg)  BMI 24.90 kg/m2  PRIOR HISTORY  Status post right bipolar hip replacement, chronic pain syndrome, chronic back pain, presents for reevaluation of her right hip, RECEIVED INJECTION   [name] says that the injection we did last time did a lot better it was not really over the greater trochanter but it was in the area between the trochanter and the lumbar spine I suspect she has an L5 or S1 nerve root problem she's had nerve blocks in the past that no longer work because interventional pain management specialist thought that it was too scarred around those areas  At this point we will repeat the injection in the same general area follow-up in 2 months   A steroid injection was performed at right hip area above the incision using 1% plain lidocaine and 40 mg of Depo-Medrol this was well tolerated

## 2015-08-11 ENCOUNTER — Ambulatory Visit (INDEPENDENT_AMBULATORY_CARE_PROVIDER_SITE_OTHER): Payer: Medicare Other | Admitting: Psychology

## 2015-08-11 DIAGNOSIS — F332 Major depressive disorder, recurrent severe without psychotic features: Secondary | ICD-10-CM | POA: Diagnosis not present

## 2015-08-11 DIAGNOSIS — F0630 Mood disorder due to known physiological condition, unspecified: Secondary | ICD-10-CM

## 2015-08-25 ENCOUNTER — Ambulatory Visit (HOSPITAL_COMMUNITY): Payer: Self-pay | Admitting: Psychology

## 2015-08-25 ENCOUNTER — Encounter (HOSPITAL_COMMUNITY): Payer: Self-pay | Admitting: Psychology

## 2015-08-25 NOTE — Progress Notes (Signed)
Patient:  Victoria Bailey   DOB: Apr 26, 1956  MR Number: [id number]  Location: [facility] [address] [place] [address] Dept: [phone]  Start: 1 PM End: 2 PM  Provider/Observer:     Edgardo Roys PSYD  Chief Complaint:      Chief Complaint  Patient presents with  . Anxiety  . Depression  . Other    Pain in her back and nerve damage in her foot    Reason For Service:     The patient was referred by her psychiatrist due to issues of significant anxiety and depression. Patient reports that she was injured at work in 1997. She reports that she was walking into her work place and that other people leaving the facility had left a rug propped up to hold the [name]. The patient reports that she tripped over this rug/door coming into the building. She started to fall forward she twisted to try to grab the wall. The patient reports that she experienced pain right away. The next morning, the patient reports that she could barely move. She reports that she went to occupational medicine and they gave her muscle relaxers first. She was then referred to an orthopedist who initially prescribed her medications and physical therapy. She also saw a chiropractor as well as utilizing his medications. The patient reports that she was not getting better and the pain continued to get worse and worse over the next 2 years. The patient reports that she then went to a new orthopedist who examined her and had an MRI conducted. It was found there were disc fragments in her back and a needed to be removed. The patient reports that by this time she lost mussel tone in several areas. After surgery she still had weakness and was started on physical therapy. Patient reports that she started getting better. However one year later she reports that she started having more severe pain again and her disc prolapse. She again went back  for physical therapy and then in 2003 she had a laminectomy and discectomy at [facility]. The patient reports that she still experiences severe pain. She has finally had a spinal fusion. She has received spinal injections for pain. The patient reports that she has had to deal with scar tissue. Patient reports that overall she has had chronic pain for years. The patient reports that as a result of all of these difficulties her marriage fell apart. The patient describes the development of significant depression and anxiety dealing with all of these losses. The patient reports that the things that she used to enjoy that she does not like to do and has had significant reduction in activities. While the patient denies any suicidal ideation she reports this is due to the fact that her son and 58-year-old granddaughter keep her going and feels like she needs to be around for other family members. 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    Interventions Strategy:  Cognitive/behavioral psychotherapeutic interventions  Participation Level:   Active  Participation Quality:  Appropriate      Behavioral Observation:  Well Groomed, Alert, and Appropriate.   Current Psychosocial Factors: The patient reports that she has not come back until now because of issues of straightening out issues related to Workmen's Comp. but this is now been done. Patient reports that she has continued to experience a great deal of depression. She reports that  she has been working on some of the initial issues that we talked about and has been looking at some of the foundational issues related to her diet, as much physical activity she can do safely, and being very careful with trying to keep her sleep pattern consistent.  Content of Session:   Reviewed current symptoms and worked on therapeutic interventions are in issues related to depression, anxiety, and coping skills.  Current Status:   The  patient reports that she has continued to experience significant issues related to depression that appears to be triggered by significant severe chronic pain.  Patient Progress:   The patient has been stable and her difficulties over the past several years.  Target Goals:   Target goals include trying to improve the patient's pain management and coping abilities around chronic pain as well as reducing intensity, severity, and duration of symptoms related to depression anxiety. Building better coping skills and intervening with cognitive/behavioral psychotherapy to try to improve the overall level of pain anxiety.  Last Reviewed:   08/11/2015  Goals Addressed Today:    Goals addressed today had to do with building better coping skills her and issues related to depression anxiety and better pain management.  Impression/Diagnosis:   Patient has a long history of severe posttraumatic pain. The patient suffered a significant injury to her back in 1997 and has gone through a long time prior to getting disc fragments removed and then has had multiple back surgeries over time. The patient continues to have difficulties from this issue all these years later. This significant medical issue has led to ongoing mood disorder/disturbance along with major depression and anxiety issues.    

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
## 2015-10-03 ENCOUNTER — Ambulatory Visit (INDEPENDENT_AMBULATORY_CARE_PROVIDER_SITE_OTHER): Payer: Worker's Compensation | Admitting: Psychology

## 2015-10-03 DIAGNOSIS — F332 Major depressive disorder, recurrent severe without psychotic features: Secondary | ICD-10-CM | POA: Diagnosis not present

## 2015-10-03 DIAGNOSIS — F0630 Mood disorder due to known physiological condition, unspecified: Secondary | ICD-10-CM

## 2015-10-05 ENCOUNTER — Ambulatory Visit (INDEPENDENT_AMBULATORY_CARE_PROVIDER_SITE_OTHER): Payer: Medicare Other | Admitting: Orthopedic Surgery

## 2015-10-05 VITALS — BP 118/73 | Ht 67.0 in | Wt 161.0 lb

## 2015-10-05 DIAGNOSIS — M25551 Pain in right hip: Secondary | ICD-10-CM | POA: Diagnosis not present

## 2015-10-05 DIAGNOSIS — M533 Sacrococcygeal disorders, not elsewhere classified: Secondary | ICD-10-CM

## 2015-10-05 DIAGNOSIS — G8929 Other chronic pain: Secondary | ICD-10-CM | POA: Diagnosis not present

## 2015-10-05 NOTE — Progress Notes (Signed)
Follow-up visit  Status post right bipolar hip replacement, chronic pain syndrome, chronic back pain, pain management and pain clinic, received right greater trochanteric injection no improvement  Pain today is more over the SI joint  Review of systems bowel bladder function normal  Exam she has point tenderness over the SI joint painful range of motion with the right hip at the SI joint motor exam unchanged hip stable pulses good appearance normal vital signs BP 118/73 mmHg  Ht 5\' 7"  (1.702 m)  Wt 161 lb (73.029 kg)  BMI 25.21 kg/m2  I injected her SI joint and I wouldn't have her come back in a month and if she is not any better or has recurrent pain in this area area was set her up for SI joint injection under fluoroscopy  Procedure note  Verbal consent to perform right SI joint injection with spinal needle  Timeout to confirm point of maximal tenderness  A steroid injection was performed at right hip area above the incision using 1% plain lidocaine and 40 mg of Depo-Medrol this was well tolerated

## 2015-10-24 ENCOUNTER — Ambulatory Visit (INDEPENDENT_AMBULATORY_CARE_PROVIDER_SITE_OTHER): Payer: Worker's Compensation | Admitting: Psychology

## 2015-10-24 DIAGNOSIS — F0630 Mood disorder due to known physiological condition, unspecified: Secondary | ICD-10-CM | POA: Diagnosis not present

## 2015-10-24 DIAGNOSIS — F332 Major depressive disorder, recurrent severe without psychotic features: Secondary | ICD-10-CM | POA: Diagnosis not present

## 2015-11-09 ENCOUNTER — Ambulatory Visit (INDEPENDENT_AMBULATORY_CARE_PROVIDER_SITE_OTHER): Payer: Medicare Other | Admitting: Orthopedic Surgery

## 2015-11-09 VITALS — BP 123/72 | Ht 67.0 in | Wt 161.0 lb

## 2015-11-09 DIAGNOSIS — G894 Chronic pain syndrome: Secondary | ICD-10-CM | POA: Diagnosis not present

## 2015-11-10 NOTE — Progress Notes (Signed)
Patient comes in after bipolar hip replacement several years ago she has chronic pain chronic back pain she's had a pain management clinic she is compliant we've injected her greater trochanter several times and that her SI joint and she notes no improvement  I told her that since these injections are not helping she should seek nerve ablation from the pain management clinic at least evaluation for such  We discussed this for approximately 10 minutes and she agrees with the treatment plan and she will follow with [place] as needed

## 2015-11-14 ENCOUNTER — Ambulatory Visit (INDEPENDENT_AMBULATORY_CARE_PROVIDER_SITE_OTHER): Payer: Medicare Other | Admitting: Psychology

## 2015-11-14 ENCOUNTER — Encounter (HOSPITAL_COMMUNITY): Payer: Self-pay | Admitting: Psychology

## 2015-11-14 DIAGNOSIS — F0630 Mood disorder due to known physiological condition, unspecified: Secondary | ICD-10-CM

## 2015-11-14 NOTE — Progress Notes (Signed)
Patient:  Victoria Bailey   DOB: [birth year]  MR Number: [id number]  Location: [facility] [address] [place] [address] Dept: [phone]  Start: 11 AM End: 12 PM  Provider/Observer:     Edgardo Roys PSYD  Chief Complaint:      Chief Complaint  Patient presents with  . Anxiety  . Depression  . Headache  . Stress    Reason For Service:     The patient was referred by her psychiatrist due to issues of significant anxiety and depression. Patient reports that she was injured at work in 1997. She reports that she was walking into her work place and that other people leaving the facility had left a rug propped up to hold the [name]. The patient reports that she tripped over this rug/door coming into the building. She started to fall forward she twisted to try to grab the wall. The patient reports that she experienced pain right away. The next morning, the patient reports that she could barely move. She reports that she went to occupational medicine and they gave her muscle relaxers first. She was then referred to an orthopedist who initially prescribed her medications and physical therapy. She also saw a chiropractor as well as utilizing his medications. The patient reports that she was not getting better and the pain continued to get worse and worse over the next 2 years. The patient reports that she then went to a new orthopedist who examined her and had an MRI conducted. It was found there were disc fragments in her back and a needed to be removed. The patient reports that by this time she lost mussel tone in several areas. After surgery she still had weakness and was started on physical therapy. Patient reports that she started getting better. However one year later she reports that she started having more severe pain again and her disc prolapse. She again went back for physical therapy and then in  2003 she had a laminectomy and discectomy at [facility]. The patient reports that she still experiences severe pain. She has finally had a spinal fusion. She has received spinal injections for pain. The patient reports that she has had to deal with scar tissue. Patient reports that overall she has had chronic pain for years. The patient reports that as a result of all of these difficulties her marriage fell apart. The patient describes the development of significant depression and anxiety dealing with all of these losses. The patient reports that the things that she used to enjoy that she does not like to do and has had significant reduction in activities. While the patient denies any suicidal ideation she reports this is due to the fact that her son and 59-year-old granddaughter keep her going and feels like she needs to be around for other family members. 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    Interventions Strategy:  Cognitive/behavioral psychotherapeutic interventions  Participation Level:   Active  Participation Quality:  Appropriate      Behavioral Observation:  Well Groomed, Alert, and Appropriate.   Current Psychosocial Factors: The patient reports that she has continued to have a lot of orthopedic issues with her neck and back as well as significant headaches. The patient reports that the pain been so difficult recently that it has had a significant negative impact on her mood. She reports that she has experienced some significant depression  recently.  Content of Session:   Reviewed current symptoms and worked on therapeutic interventions are in issues related to depression, anxiety, and coping skills.  Current Status:   The patient reports that she has continued to experience significant issues related to depression that appears to be triggered by significant severe chronic pain.  The patient reports that she has been working on getting more  physical activity and eating on a more consistent basis. However, she acknowledges that when she gets depressed because of all the things that she can't do now that her symptoms deteriorate and she will not eat well or even go an entire day without eating. We worked on building better coping skills around these issues.  Patient Progress:   The patient has been stable and her difficulties over the past several years.  Target Goals:   Target goals include trying to improve the patient's pain management and coping abilities around chronic pain as well as reducing intensity, severity, and duration of symptoms related to depression anxiety. Building better coping skills and intervening with cognitive/behavioral psychotherapy to try to improve the overall level of pain anxiety.  Last Reviewed:   08/11/2015  Goals Addressed Today:    Goals addressed today had to do with building better coping skills her and issues related to depression anxiety and better pain management.  Impression/Diagnosis:   Patient has a long history of severe posttraumatic pain. The patient suffered a significant injury to her back in 1997 and has gone through a long time prior to getting disc fragments removed and then has had multiple back surgeries over time. The patient continues to have difficulties from this issue all these years later. This significant medical issue has led to ongoing mood disorder/disturbance along with major depression and anxiety issues.    Diagnosis:    Axis I: Mood disorder due to a general medical condition ([place])

## 2015-11-27 ENCOUNTER — Ambulatory Visit (HOSPITAL_COMMUNITY): Payer: Self-pay | Admitting: Psychology

## 2015-12-19 ENCOUNTER — Encounter (HOSPITAL_COMMUNITY): Payer: Self-pay | Admitting: Psychology

## 2015-12-19 ENCOUNTER — Ambulatory Visit (INDEPENDENT_AMBULATORY_CARE_PROVIDER_SITE_OTHER): Payer: Worker's Compensation | Admitting: Psychology

## 2015-12-19 DIAGNOSIS — F0630 Mood disorder due to known physiological condition, unspecified: Secondary | ICD-10-CM

## 2015-12-19 NOTE — Progress Notes (Signed)
Patient:  Victoria Bailey   DOB: 07/21/1956  MR Number: [id number]  Location: [facility] [address] [place] [address] Dept: [phone]  Start: 11 AM End: 12 PM  Provider/Observer:     Edgardo Roys PSYD  Chief Complaint:      Chief Complaint  Patient presents with  . Depression  . Stress    Reason For Service:     The patient was referred by her psychiatrist due to issues of significant anxiety and depression. Patient reports that she was injured at work in 1997. She reports that she was walking into her work place and that other people leaving the facility had left a rug propped up to hold the [name]. The patient reports that she tripped over this rug/door coming into the building. She started to fall forward she twisted to try to grab the wall. The patient reports that she experienced pain right away. The next morning, the patient reports that she could barely move. She reports that she went to occupational medicine and they gave her muscle relaxers first. She was then referred to an orthopedist who initially prescribed her medications and physical therapy. She also saw a chiropractor as well as utilizing his medications. The patient reports that she was not getting better and the pain continued to get worse and worse over the next 2 years. The patient reports that she then went to a new orthopedist who examined her and had an MRI conducted. It was found there were disc fragments in her back and a needed to be removed. The patient reports that by this time she lost mussel tone in several areas. After surgery she still had weakness and was started on physical therapy. Patient reports that she started getting better. However one year later she reports that she started having more severe pain again and her disc prolapse. She again went back for physical therapy and then in 2003 she had a  laminectomy and [facility]cectomy at [facility]. The patient reports that she still experiences severe pain. She has finally had a spinal fusion. She has received spinal injections for pain. The patient reports that she has had to deal with scar tissue. Patient reports that overall she has had chronic pain for years. The patient reports that as a result of all of these difficulties her marriage fell apart. The patient describes the development of significant depression and anxiety dealing with all of these losses. The patient reports that the things that she used to enjoy that she does not like to do and has had significant reduction in activities. While the patient denies any suicidal ideation she reports this is due to the fact that her son and 59-year-old granddaughter keep her going and feels like she needs to be around for other family members. 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    Interventions Strategy:  Cognitive/behavioral psychotherapeutic interventions  Participation Level:   Active  Participation Quality:  Appropriate      Behavioral Observation:  Well Groomed, Alert, and Appropriate.   Current Psychosocial Factors: The patient reports that she has continued to have a lot of difficulties with her neck and back pain. She has salt care with a chiropractor regarding her neck recently but has been taking a very conservatively. The patient reports that she has been working on her coping skills and trying to get out as much as she can put  her physical limitations continue to make his problematic.  Content of Session:   Reviewed current symptoms and worked on therapeutic interventions are in issues related to depression, anxiety, and coping skills.  Current Status:   The patient reports that her depressive symptoms have improved to some degree and she is actively working on coping skills and strategies we have been developing.  Patient Progress:   The  patient has been stable and her difficulties over the past several years.  Target Goals:   Target goals include trying to improve the patient's pain management and coping abilities around chronic pain as well as reducing intensity, severity, and duration of symptoms related to depression anxiety. Building better coping skills and intervening with cognitive/behavioral psychotherapy to try to improve the overall level of pain anxiety.  Last Reviewed:   12/19/2015  Goals Addressed Today:    Goals addressed today had to do with building better coping skills her and issues related to depression anxiety and better pain management.  Impression/Diagnosis:   Patient has a long history of severe posttraumatic pain. The patient suffered a significant injury to her back in 1997 and has gone through a long time prior to getting disc fragments removed and then has had multiple back surgeries over time. The patient continues to have difficulties from this issue all these years later. This significant medical issue has led to ongoing mood disorder/disturbance along with major depression and anxiety issues.    Diagnosis:    Axis I: Mood disorder due to a general medical condition ([place])

## 2015-12-19 NOTE — Progress Notes (Signed)
Patient:  Victoria Bailey   DOB: 1956/04/19  MR Number: [id number]  Location: [facility] [address] [place] [address] Dept: [phone]  Start:  2 PM End:  3 PM  Provider/Observer:     Edgardo Roys PSYD  Chief Complaint:      Chief Complaint  Patient presents with  . Depression  . Anxiety  . Stress    Reason For Service:     The patient was referred by her psychiatrist due to issues of significant anxiety and depression. Patient reports that she was injured at work in 1997. She reports that she was walking into her work place and that other people leaving the facility had left a rug propped up to hold the [name]. The patient reports that she tripped over this rug/door coming into the building. She started to fall forward she twisted to try to grab the wall. The patient reports that she experienced pain right away. The next morning, the patient reports that she could barely move. She reports that she went to occupational medicine and they gave her muscle relaxers first. She was then referred to an orthopedist who initially prescribed her medications and physical therapy. She also saw a chiropractor as well as utilizing his medications. The patient reports that she was not getting better and the pain continued to get worse and worse over the next 2 years. The patient reports that she then went to a new orthopedist who examined her and had an MRI conducted. It was found there were disc fragments in her back and a needed to be removed. The patient reports that by this time she lost mussel tone in several areas. After surgery she still had weakness and was started on physical therapy. Patient reports that she started getting better. However one year later she reports that she started having more severe pain again and her disc prolapse. She again went back for physical therapy and then in 2003 she had  a laminectomy and discectomy at [facility]. The patient reports that she still experiences severe pain. She has finally had a spinal fusion. She has received spinal injections for pain. The patient reports that she has had to deal with scar tissue. Patient reports that overall she has had chronic pain for years. The patient reports that as a result of all of these difficulties her marriage fell apart. The patient describes the development of significant depression and anxiety dealing with all of these losses. The patient reports that the things that she used to enjoy that she does not like to do and has had significant reduction in activities. While the patient denies any suicidal ideation she reports this is due to the fact that her son and 56-year-old granddaughter keep her going and feels like she needs to be around for other family members. 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    Interventions Strategy:  Cognitive/behavioral psychotherapeutic interventions  Participation Level:   Active  Participation Quality:  Appropriate      Behavioral Observation:  Well Groomed, Alert, and Appropriate.   Current Psychosocial Factors: The patient reports that  She has been actively working on her coping skills and strategies. She is been very limited from standpoint physical activity but has been trying to improve her diet as well as been time around people that she trusts and feels comfortable with.  Content of Session:   Reviewed current  symptoms and worked on therapeutic interventions are in issues related to depression, anxiety, and coping skills.  Current Status:   The patient reports that she has continued to experience significant issues related to depression that appears to be triggered by significant severe chronic pain.   Chronic pain continues to be a major obstacle for her.  Patient Progress:   The patient has been stable and her difficulties over the  past several years.  Target Goals:   Target goals include trying to improve the patient's pain management and coping abilities around chronic pain as well as reducing intensity, severity, and duration of symptoms related to depression anxiety. Building better coping skills and intervening with cognitive/behavioral psychotherapy to try to improve the overall level of pain anxiety.  Last Reviewed:     10/24/2015  Goals Addressed Today:    Goals addressed today had to do with building better coping skills her and issues related to depression anxiety and better pain management.  Impression/Diagnosis:   Patient has a long history of severe posttraumatic pain. The patient suffered a significant injury to her back in 1997 and has gone through a long time prior to getting disc fragments removed and then has had multiple back surgeries over time. The patient continues to have difficulties from this issue all these years later. This significant medical issue has led to ongoing mood disorder/disturbance along with major depression and anxiety issues.    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

## 2015-12-19 NOTE — Progress Notes (Signed)
Patient:  Victoria Bailey   DOB: 02-Mar-1956  MR Number: [id number]  Location: [facility] [address] [place] [address] Dept: [phone]  Start:  2 PM End:  3 PM  Provider/Observer:     Edgardo Roys PSYD  Chief Complaint:      Chief Complaint  Patient presents with  . Depression  . Anxiety  . Stress    Reason For Service:     The patient was referred by her psychiatrist due to issues of significant anxiety and depression. Patient reports that she was injured at work in 1997. She reports that she was walking into her work place and that other people leaving the facility had left a rug propped up to hold the [name]. The patient reports that she tripped over this rug/door coming into the building. She started to fall forward she twisted to try to grab the wall. The patient reports that she experienced pain right away. The next morning, the patient reports that she could barely move. She reports that she went to occupational medicine and they gave her muscle relaxers first. She was then referred to an orthopedist who initially prescribed her medications and physical therapy. She also saw a chiropractor as well as utilizing his medications. The patient reports that she was not getting better and the pain continued to get worse and worse over the next 2 years. The patient reports that she then went to a new orthopedist who examined her and had an MRI conducted. It was found there were disc fragments in her back and a needed to be removed. The patient reports that by this time she lost mussel tone in several areas. After surgery she still had weakness and was started on physical therapy. Patient reports that she started getting better. However one year later she reports that she started having more severe pain again and her disc prolapse. She again went back for physical therapy and then in 2003 she had  a laminectomy and discectomy at [facility]. The patient reports that she still experiences severe pain. She has finally had a spinal fusion. She has received spinal injections for pain. The patient reports that she has had to deal with scar tissue. Patient reports that overall she has had chronic pain for years. The patient reports that as a result of all of these difficulties her marriage fell apart. The patient describes the development of significant depression and anxiety dealing with all of these losses. The patient reports that the things that she used to enjoy that she does not like to do and has had significant reduction in activities. While the patient denies any suicidal ideation she reports this is due to the fact that her son and 6-year-old granddaughter keep her going and feels like she needs to be around for other family members. 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    Interventions Strategy:  Cognitive/behavioral psychotherapeutic interventions  Participation Level:   Active  Participation Quality:  Appropriate      Behavioral Observation:  Well Groomed, Alert, and Appropriate.   Current Psychosocial Factors: The patient reports that  She is still had a lot of financial stress and other difficulties associated with her severe medical issue. The patient reports that she is continuing to have severe issues with her back.  Content of Session:   Reviewed current symptoms and worked on therapeutic interventions are in issues related  to depression, anxiety, and coping skills.  Current Status:   The patient reports that she has continued to experience significant issues related to depression that appears to be triggered by significant severe chronic pain.  Patient Progress:   The patient has been stable and her difficulties over the past several years.  Target Goals:   Target goals include trying to improve the patient's pain management and  coping abilities around chronic pain as well as reducing intensity, severity, and duration of symptoms related to depression anxiety. Building better coping skills and intervening with cognitive/behavioral psychotherapy to try to improve the overall level of pain anxiety.  Last Reviewed:    10/03/2015  Goals Addressed Today:    Goals addressed today had to do with building better coping skills her and issues related to depression anxiety and better pain management.  Impression/Diagnosis:   Patient has a long history of severe posttraumatic pain. The patient suffered a significant injury to her back in 1997 and has gone through a long time prior to getting disc fragments removed and then has had multiple back surgeries over time. The patient continues to have difficulties from this issue all these years later. This significant medical issue has led to ongoing mood disorder/disturbance along with major depression and anxiety issues.    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

## 2016-01-22 ENCOUNTER — Ambulatory Visit (HOSPITAL_COMMUNITY): Payer: Worker's Compensation | Admitting: Psychology

## 2016-02-22 ENCOUNTER — Ambulatory Visit (INDEPENDENT_AMBULATORY_CARE_PROVIDER_SITE_OTHER): Payer: Worker's Compensation | Admitting: Psychology

## 2016-02-22 DIAGNOSIS — F332 Major depressive disorder, recurrent severe without psychotic features: Secondary | ICD-10-CM | POA: Diagnosis not present

## 2016-02-22 DIAGNOSIS — F0630 Mood disorder due to known physiological condition, unspecified: Secondary | ICD-10-CM

## 2016-03-15 ENCOUNTER — Ambulatory Visit (INDEPENDENT_AMBULATORY_CARE_PROVIDER_SITE_OTHER): Payer: Worker's Compensation | Admitting: Psychology

## 2016-03-15 ENCOUNTER — Encounter (HOSPITAL_COMMUNITY): Payer: Self-pay | Admitting: Psychology

## 2016-03-15 DIAGNOSIS — F0630 Mood disorder due to known physiological condition, unspecified: Secondary | ICD-10-CM | POA: Diagnosis not present

## 2016-03-15 NOTE — Progress Notes (Signed)
Patient:  Victoria Bailey   DOB: [birth year]  MR Number: [id number]  Location: [facility] [address] [place] [address] Dept: [phone]  Start: 1 PM End: 2 PM  Provider/Observer:     Edgardo Roys PSYD  Chief Complaint:      Chief Complaint  Patient presents with  . Depression  . Anxiety  . Stress    Reason For Service:     The patient was referred by her psychiatrist due to issues of significant anxiety and depression. Patient reports that she was injured at work in 1997. She reports that she was walking into her work place and that other people leaving the facility had left a rug propped up to hold the [name]. The patient reports that she tripped over this rug/door coming into the building. She started to fall forward she twisted to try to grab the wall. The patient reports that she experienced pain right away. The next morning, the patient reports that she could barely move. She reports that she went to occupational medicine and they gave her muscle relaxers first. She was then referred to an orthopedist who initially prescribed her medications and physical therapy. She also saw a chiropractor as well as utilizing his medications. The patient reports that she was not getting better and the pain continued to get worse and worse over the next 2 years. The patient reports that she then went to a new orthopedist who examined her and had an MRI conducted. It was found there were disc fragments in her back and a needed to be removed. The patient reports that by this time she lost mussel tone in several areas. After surgery she still had weakness and was started on physical therapy. Patient reports that she started getting better. However one year later she reports that she started having more severe pain again and her disc prolapse. She again went back for physical therapy and then in 2003 she had a  laminectomy and discectomy at [facility]. The patient reports that she still experiences severe pain. She has finally had a spinal fusion. She has received spinal injections for pain. The patient reports that she has had to deal with scar tissue. Patient reports that overall she has had chronic pain for years. The patient reports that as a result of all of these difficulties her marriage fell apart. The patient describes the development of significant depression and anxiety dealing with all of these losses. The patient reports that the things that she used to enjoy that she does not like to do and has had significant reduction in activities. While the patient denies any suicidal ideation she reports this is due to the fact that her son and 59-year-old granddaughter keep her going and feels like she needs to be around for other family members. 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    Interventions Strategy:  Cognitive/behavioral psychotherapeutic interventions  Participation Level:   Active  Participation Quality:  Appropriate      Behavioral Observation:  Well Groomed, Alert, and Appropriate.   Current Psychosocial Factors: The patient reports that she has been doing better overall. She reports that she is working on physical activity as much as she can with her physical limitations pain close attention to her diet sleep patterns. She reports that she has had some stressors relative to her best friend who is constantly calling her and asking her  to help the friend out in various ways this far as emotional support. However, the friends like the friend has a very severe somatizations disorder.  Content of Session:   Reviewed current symptoms and worked on therapeutic interventions are in issues related to depression, anxiety, and coping skills.  Current Status:   The patient reports that her depressive symptoms have improved to some degree and she is actively  working on coping skills and strategies we have been developing.  The patient reports that her pain is continued but that her overall mood status has improved with regard to symptoms of depression and stress.   Patient Progress:   The patient has been stable and her difficulties over the past several years.  Target Goals:   Target goals include trying to improve the patient's pain management and coping abilities around chronic pain as well as reducing intensity, severity, and duration of symptoms related to depression anxiety. Building better coping skills and intervening with cognitive/behavioral psychotherapy to try to improve the overall level of pain anxiety.  Last Reviewed:   03/15/2016  Goals Addressed Today:    Goals addressed today had to do with building better coping skills her and issues related to depression anxiety and better pain management.  Impression/Diagnosis:   Patient has a long history of severe posttraumatic pain. The patient suffered a significant injury to her back in 1997 and has gone through a long time prior to getting disc fragments removed and then has had multiple back surgeries over time. The patient continues to have difficulties from this issue all these years later. This significant medical issue has led to ongoing mood disorder/disturbance along with major depression and anxiety issues.    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

## 2016-04-08 ENCOUNTER — Ambulatory Visit (INDEPENDENT_AMBULATORY_CARE_PROVIDER_SITE_OTHER): Payer: Worker's Compensation | Admitting: Psychology

## 2016-04-08 DIAGNOSIS — F0630 Mood disorder due to known physiological condition, unspecified: Secondary | ICD-10-CM

## 2016-04-08 DIAGNOSIS — F332 Major depressive disorder, recurrent severe without psychotic features: Secondary | ICD-10-CM

## 2016-05-15 ENCOUNTER — Encounter (HOSPITAL_COMMUNITY): Payer: Self-pay | Admitting: Psychology

## 2016-05-15 NOTE — Progress Notes (Signed)
Patient:  Victoria Bailey   DOB: August 27, 1956  MR Number: [id number]  Location: [facility] [address] [place] [address] Dept: [phone]  Start: 11 AM End: 12 PM  Provider/Observer:     Edgardo Roys PSYD  Chief Complaint:      Chief Complaint  Patient presents with  . Depression  . Anxiety  . Stress    Reason For Service:     The patient was referred by her psychiatrist due to issues of significant anxiety and depression. Patient reports that she was injured at work in 1997. She reports that she was walking into her work place and that other people leaving the facility had left a rug propped up to hold the [name]. The patient reports that she tripped over this rug/door coming into the building. She started to fall forward she twisted to try to grab the wall. The patient reports that she experienced pain right away. The next morning, the patient reports that she could barely move. She reports that she went to occupational medicine and they gave her muscle relaxers first. She was then referred to an orthopedist who initially prescribed her medications and physical therapy. She also saw a chiropractor as well as utilizing his medications. The patient reports that she was not getting better and the pain continued to get worse and worse over the next 2 years. The patient reports that she then went to a new orthopedist who examined her and had an MRI conducted. It was found there were disc fragments in her back and a needed to be removed. The patient reports that by this time she lost mussel tone in several areas. After surgery she still had weakness and was started on physical therapy. Patient reports that she started getting better. However one year later she reports that she started having more severe pain again and her disc prolapse. She again went back for physical therapy and then in 2003 she had  a laminectomy and discectomy at [facility]. The patient reports that she still experiences severe pain. She has finally had a spinal fusion. She has received spinal injections for pain. The patient reports that she has had to deal with scar tissue. Patient reports that overall she has had chronic pain for years. The patient reports that as a result of all of these difficulties her marriage fell apart. The patient describes the development of significant depression and anxiety dealing with all of these losses. The patient reports that the things that she used to enjoy that she does not like to do and has had significant reduction in activities. While the patient denies any suicidal ideation she reports this is due to the fact that her son and 70-year-old granddaughter keep her going and feels like she needs to be around for other family members. 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    Interventions Strategy:  Cognitive/behavioral psychotherapeutic interventions  Participation Level:   Active  Participation Quality:  Appropriate      Behavioral Observation:  Well Groomed, Alert, and Appropriate.   Current Psychosocial Factors: The patient reports that she has continued to have a lot of difficulties with her neck and back pain. She has salt care with a chiropractor regarding her neck recently but has been taking a very conservatively. The patient reports that she has been working on her coping skills and trying to get out as much as  she can put her physical limitations continue to make his problematic.  Content of Session:   Reviewed current symptoms and worked on therapeutic interventions are in issues related to depression, anxiety, and coping skills.  Current Status:   The patient reports that her depressive symptoms have improved to some degree and she is actively working on coping skills and strategies we have been developing.  Patient Progress:   The  patient has been stable and her difficulties over the past several years.  Target Goals:   Target goals include trying to improve the patient's pain management and coping abilities around chronic pain as well as reducing intensity, severity, and duration of symptoms related to depression anxiety. Building better coping skills and intervening with cognitive/behavioral psychotherapy to try to improve the overall level of pain anxiety.  Last Reviewed:   02/22/2016  Goals Addressed Today:    Goals addressed today had to do with building better coping skills her and issues related to depression anxiety and better pain management.  Impression/Diagnosis:   Patient has a long history of severe posttraumatic pain. The patient suffered a significant injury to her back in 1997 and has gone through a long time prior to getting disc fragments removed and then has had multiple back surgeries over time. The patient continues to have difficulties from this issue all these years later. This significant medical issue has led to ongoing mood disorder/disturbance along with major depression and anxiety issues.    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

## 2016-06-26 ENCOUNTER — Ambulatory Visit (HOSPITAL_COMMUNITY): Payer: Worker's Compensation | Admitting: Psychology

## 2016-07-29 ENCOUNTER — Ambulatory Visit (HOSPITAL_COMMUNITY): Payer: Worker's Compensation | Admitting: Psychology

## 2016-08-28 ENCOUNTER — Encounter (HOSPITAL_COMMUNITY): Payer: Self-pay | Admitting: Psychology

## 2016-08-28 NOTE — Progress Notes (Signed)
Patient:  Victoria Bailey   DOB: Oct 12, 1956  MR Number: [id number]  Location: [facility] [address] [place] [address] Dept: [phone]  Start: 1 PM End: 2 PM  Provider/Observer:     Edgardo Roys PSYD  Chief Complaint:      Chief Complaint  Patient presents with  . Anxiety  . Depression  . Stress    Reason For Service:     The patient was referred by her psychiatrist due to issues of significant anxiety and depression. Patient reports that she was injured at work in 1997. She reports that she was walking into her work place and that other people leaving the facility had left a rug propped up to hold the Victoria Bailey. The patient reports that she tripped over this rug/door coming into the building. She started to fall forward she twisted to try to grab the wall. The patient reports that she experienced pain right away. The next morning, the patient reports that she could barely move. She reports that she went to occupational medicine and they gave her muscle relaxers first. She was then referred to an orthopedist who initially prescribed her medications and physical therapy. She also saw a chiropractor as well as utilizing his medications. The patient reports that she was not getting better and the pain continued to get worse and worse over the next 2 years. The patient reports that she then went to a new orthopedist who examined her and had an MRI conducted. It was found there were disc fragments in her back and a needed to be removed. The patient reports that by this time she lost mussel tone in several areas. After surgery she still had weakness and was started on physical therapy. Patient reports that she started getting better. However one year later she reports that she started having more severe pain again and her disc prolapse. She again went back for physical therapy and then in 2003 she had a  laminectomy and discectomy at [facility]. The patient reports that she still experiences severe pain. She has finally had a spinal fusion. She has received spinal injections for pain. The patient reports that she has had to deal with scar tissue. Patient reports that overall she has had chronic pain for years. The patient reports that as a result of all of these difficulties her marriage fell apart. The patient describes the development of significant depression and anxiety dealing with all of these losses. The patient reports that the things that she used to enjoy that she does not like to do and has had significant reduction in activities. While the patient denies any suicidal ideation she reports this is due to the fact that her son and 23-year-old granddaughter keep her going and feels like she needs to be around for other family members. 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    Interventions Strategy:  Cognitive/behavioral psychotherapeutic interventions  Participation Level:   Active  Participation Quality:  Appropriate      Behavioral Observation:  Well Groomed, Alert, and Appropriate.   Current Psychosocial Factors: The patient reports that she has been doing better overall. She reports that she is working on physical activity as much as she can with her physical limitations pain close attention to her diet sleep patterns. She reports that she has had some stressors relative to her best friend who is constantly calling her and asking her  to help the friend out in various ways this far as emotional support. However, the friends like the friend has a very severe somatizations disorder.  Content of Session:   Reviewed current symptoms and worked on therapeutic interventions are in issues related to depression, anxiety, and coping skills.  Current Status:   The patient reports that her depressive symptoms have improved to some degree and she is actively  working on coping skills and strategies we have been developing.  The patient reports that her pain is continued but that her overall mood status has improved with regard to symptoms of depression and stress.   Patient Progress:   The patient has been stable and her difficulties over the past several years.  Target Goals:   Target goals include trying to improve the patient's pain management and coping abilities around chronic pain as well as reducing intensity, severity, and duration of symptoms related to depression anxiety. Building better coping skills and intervening with cognitive/behavioral psychotherapy to try to improve the overall level of pain anxiety.  Last Reviewed:   04/10/2016  Goals Addressed Today:    Goals addressed today had to do with building better coping skills her and issues related to depression anxiety and better pain management.  Impression/Diagnosis:   Patient has a long history of severe posttraumatic pain. The patient suffered a significant injury to her back in 1997 and has gone through a long time prior to getting disc fragments removed and then has had multiple back surgeries over time. The patient continues to have difficulties from this issue all these years later. This significant medical issue has led to ongoing mood disorder/disturbance along with major depression and anxiety issues.    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

## 2016-08-29 ENCOUNTER — Ambulatory Visit (HOSPITAL_COMMUNITY): Payer: Self-pay | Admitting: Psychology

## 2016-09-10 ENCOUNTER — Ambulatory Visit (INDEPENDENT_AMBULATORY_CARE_PROVIDER_SITE_OTHER): Payer: Worker's Compensation | Admitting: Psychology

## 2016-09-10 DIAGNOSIS — F0630 Mood disorder due to known physiological condition, unspecified: Secondary | ICD-10-CM | POA: Diagnosis not present

## 2016-09-10 DIAGNOSIS — F332 Major depressive disorder, recurrent severe without psychotic features: Secondary | ICD-10-CM

## 2016-09-13 ENCOUNTER — Encounter (HOSPITAL_COMMUNITY): Payer: Self-pay | Admitting: Psychology

## 2016-09-13 NOTE — Progress Notes (Signed)
Patient:  Victoria Bailey   DOB: Nov 03, 1955  MR Number: [id number]  Location: [facility] [address] [place] [address] Dept: [phone]  Start: 1 PM End: 2 PM  Provider/Observer:     Edgardo Roys PSYD  Chief Complaint:      Chief Complaint  Patient presents with  . Anxiety  . Depression  . Stress    Reason For Service:     The patient was referred by her psychiatrist due to issues of significant anxiety and depression. Patient reports that she was injured at work in 1997. She reports that she was walking into her work place and that other people leaving the facility had left a rug propped up to hold the [name]. The patient reports that she tripped over this rug/door coming into the building. She started to fall forward she twisted to try to grab the wall. The patient reports that she experienced pain right away. The next morning, the patient reports that she could barely move. She reports that she went to occupational medicine and they gave her muscle relaxers first. She was then referred to an orthopedist who initially prescribed her medications and physical therapy. She also saw a chiropractor as well as utilizing his medications. The patient reports that she was not getting better and the pain continued to get worse and worse over the next 2 years. The patient reports that she then went to a new orthopedist who examined her and had an MRI conducted. It was found there were disc fragments in her back and a needed to be removed. The patient reports that by this time she lost mussel tone in several areas. After surgery she still had weakness and was started on physical therapy. Patient reports that she started getting better. However one year later she reports that she started having more severe pain again and her disc prolapse. She again went back for physical therapy and then in 2003 she had a  laminectomy and discectomy at [facility]. The patient reports that she still experiences severe pain. She has finally had a spinal fusion. She has received spinal injections for pain. The patient reports that she has had to deal with scar tissue. Patient reports that overall she has had chronic pain for years. The patient reports that as a result of all of these difficulties her marriage fell apart. The patient describes the development of significant depression and anxiety dealing with all of these losses. The patient reports that the things that she used to enjoy that she does not like to do and has had significant reduction in activities. While the patient denies any suicidal ideation she reports this is due to the fact that her son and 35-year-old granddaughter keep her going and feels like she needs to be around for other family members. 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    Interventions Strategy:  Cognitive/behavioral psychotherapeutic interventions  Participation Level:   Active  Participation Quality:  Appropriate      Behavioral Observation:  Well Groomed, Alert, and Appropriate.   Current Psychosocial Factors: The patient reports that she has continued to work hard on coping skills and this has helped her mood and ability to be around others and do what she can.  However, her physical limitations continued to be major stress for her..  Content of Session:   Reviewed current symptoms and worked on therapeutic interventions are  in issues related to depression, anxiety, and coping skills.  Current Status:   The patient reports that her depressive symptoms have improved to some degree and she is actively working on coping skills and strategies we have been developing.  The patient reports that her pain is continued but that her overall mood status has improved with regard to symptoms of depression and stress.   Patient Progress:   The patient has  been stable and her difficulties over the past several years.  Target Goals:   Target goals include trying to improve the patient's pain management and coping abilities around chronic pain as well as reducing intensity, severity, and duration of symptoms related to depression anxiety. Building better coping skills and intervening with cognitive/behavioral psychotherapy to try to improve the overall level of pain anxiety.  Last Reviewed:   09/10/2016  Goals Addressed Today:    Goals addressed today had to do with building better coping skills her and issues related to depression anxiety and better pain management.  Impression/Diagnosis:   Patient has a long history of severe posttraumatic pain. The patient suffered a significant injury to her back in 1997 and has gone through a long time prior to getting disc fragments removed and then has had multiple back surgeries over time. The patient continues to have difficulties from this issue all these years later. This significant medical issue has led to ongoing mood disorder/disturbance along with major depression and anxiety issues.    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

## 2016-10-10 ENCOUNTER — Ambulatory Visit (INDEPENDENT_AMBULATORY_CARE_PROVIDER_SITE_OTHER): Payer: Worker's Compensation | Admitting: Psychology

## 2016-10-10 DIAGNOSIS — F0630 Mood disorder due to known physiological condition, unspecified: Secondary | ICD-10-CM | POA: Diagnosis not present

## 2016-11-11 NOTE — Progress Notes (Signed)
Patient:  Victoria Bailey   DOB: 09-13-1956  MR Number: [id number]  Location: [facility] [address] [place] [address] Dept: [phone]  Start: 1 PM End: 2 PM  Provider/Observer:     Edgardo Roys PSYD  Chief Complaint:      Chief Complaint  Patient presents with  . Anxiety  . Depression  . Stress  . Other    Pain    Reason For Service:     The patient was referred by her psychiatrist due to issues of significant anxiety and depression. Patient reports that she was injured at work in 1997. She reports that she was walking into her work place and that other people leaving the facility had left a rug propped up to hold the [name]. The patient reports that she tripped over this rug/door coming into the building. She started to fall forward she twisted to try to grab the wall. The patient reports that she experienced pain right away. The next morning, the patient reports that she could barely move. She reports that she went to occupational medicine and they gave her muscle relaxers first. She was then referred to an orthopedist who initially prescribed her medications and physical therapy. She also saw a chiropractor as well as utilizing his medications. The patient reports that she was not getting better and the pain continued to get worse and worse over the next 2 years. The patient reports that she then went to a new orthopedist who examined her and had an MRI conducted. It was found there were disc fragments in her back and a needed to be removed. The patient reports that by this time she lost mussel tone in several areas. After surgery she still had weakness and was started on physical therapy. Patient reports that she started getting better. However one year later she reports that she started having more severe pain again and her disc prolapse. She again went back for physical therapy and then  in 2003 she had a laminectomy and discectomy at [facility]. The patient reports that she still experiences severe pain. She has finally had a spinal fusion. She has received spinal injections for pain. The patient reports that she has had to deal with scar tissue. Patient reports that overall she has had chronic pain for years. The patient reports that as a result of all of these difficulties her marriage fell apart. The patient describes the development of significant depression and anxiety dealing with all of these losses. The patient reports that the things that she used to enjoy that she does not like to do and has had significant reduction in activities. While the patient denies any suicidal ideation she reports this is due to the fact that her son and 31-year-old granddaughter keep her going and feels like she needs to be around for other family members. 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    Interventions Strategy:  Cognitive/behavioral psychotherapeutic interventions  Participation Level:   Active  Participation Quality:  Appropriate      Behavioral Observation:  Well Groomed, Alert, and Appropriate.   Current Psychosocial Factors: The patient reports that she has continued to work hard on coping skills and this has helped her mood and ability to be around others and do what she can.  However, her physical limitations continued to be major stress for her..  Content of Session:   Reviewed current  symptoms and worked on therapeutic interventions are in issues related to depression, anxiety, and coping skills.  Current Status:   The patient reports that her depressive symptoms have improved to some degree and she is actively working on coping skills and strategies we have been developing.  The patient reports that her pain is continued but that her overall mood status has improved with regard to symptoms of depression and stress.   Patient  Progress:   The patient has been stable and her difficulties over the past several years.  Target Goals:   Target goals include trying to improve the patient's pain management and coping abilities around chronic pain as well as reducing intensity, severity, and duration of symptoms related to depression anxiety. Building better coping skills and intervening with cognitive/behavioral psychotherapy to try to improve the overall level of pain anxiety.  Last Reviewed:   10/10/2016  Goals Addressed Today:    Goals addressed today had to do with building better coping skills her and issues related to depression anxiety and better pain management.  Impression/Diagnosis:   Patient has a long history of severe posttraumatic pain. The patient suffered a significant injury to her back in 1997 and has gone through a long time prior to getting disc fragments removed and then has had multiple back surgeries over time. The patient continues to have difficulties from this issue all these years later. This significant medical issue has led to ongoing mood disorder/disturbance along with major depression and anxiety issues.    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

## 2018-07-10 ENCOUNTER — Encounter (INDEPENDENT_AMBULATORY_CARE_PROVIDER_SITE_OTHER): Payer: Self-pay | Admitting: *Deleted

## 2018-08-26 ENCOUNTER — Encounter (INDEPENDENT_AMBULATORY_CARE_PROVIDER_SITE_OTHER): Payer: Self-pay | Admitting: Internal Medicine

## 2018-08-26 ENCOUNTER — Ambulatory Visit (INDEPENDENT_AMBULATORY_CARE_PROVIDER_SITE_OTHER): Payer: Medicare Other | Admitting: Internal Medicine

## 2018-08-26 VITALS — BP 182/100 | HR 76 | Temp 98.1°F | Ht 67.0 in | Wt 166.5 lb

## 2018-08-26 DIAGNOSIS — I10 Essential (primary) hypertension: Secondary | ICD-10-CM

## 2018-08-26 DIAGNOSIS — K625 Hemorrhage of anus and rectum: Secondary | ICD-10-CM

## 2018-08-26 HISTORY — DX: Essential (primary) hypertension: I10

## 2018-08-26 NOTE — Patient Instructions (Signed)
The risks of bleeding, perforation and infection were reviewed with patient.  

## 2018-08-26 NOTE — Progress Notes (Addendum)
Subjective:    Patient ID: Victoria Bailey, female    DOB: [birth year], 62 y.o.   MRN: [id number]  HPI  Referred by Dr Shearon Stalls for rectal bleeding. She says she has seen some bleeding when she has a BM. Has had rectal bleeding for about a year off and on. She thinks she is bleeding from the hemorrhoids. Her last colonoscopy was in October of 2010 and was normal.  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 Dr. Aida Raider. Family hx of colon cancer in maternal grandmother. No weight loss. Appetite is good. No abdominal pain. Has a BM x 1 a day.   05/18/2018 H and H 13.7 and 44.0  Review of Systems Past Medical History:  Diagnosis Date  . Foot drop   . Migraine   . Osteoporosis   . 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 ABDOMINAL HYSTERECTOMY    . BACK SURGERY    . HIP SURGERY     rt bipolar 10.26.2011  . JOINT REPLACEMENT    . NEPHRECTOMY      Allergies  Allergen Reactions  . Latex Other (See Comments)    Reaction unknown-per patient, MD confirmed the allergy  . Oxycodone Hcl Nausea And Vomiting  . Sulfa Drugs Cross Reactors Nausea And Vomiting  . Doxycycline Hives and Rash    Current Outpatient Medications on File Prior to Visit  Medication Sig Dispense Refill  . ALPRAZolam (XANAX) 0.5 MG tablet Take 0.5 mg by mouth at bedtime as needed for anxiety.    Marland Kitchen amitriptyline (ELAVIL) 10 MG tablet Take 10 mg by mouth at bedtime.    Marland Kitchen azelastine (ASTELIN) 0.1 % nasal spray Place into both nostrils as needed for rhinitis. Use in each nostril as directed    . buPROPion (WELLBUTRIN SR) 150 MG 12 hr tablet Take 150 mg by mouth 2 (two) times daily.    . calcium-vitamin D (OSCAL WITH D) 250-125 MG-UNIT tablet Take 1 tablet by mouth daily.    . cetirizine (ZYRTEC) 10 MG tablet Take 10 mg by mouth daily.    Marland Kitchen docusate sodium (COLACE) 100 MG capsule Take 100 mg by mouth daily as needed for mild  constipation.    Marland Kitchen esomeprazole (NEXIUM) 40 MG capsule Take 40 mg by mouth daily at 12 noon.    Marland Kitchen HYDROcodone-acetaminophen (NORCO/VICODIN) 5-325 MG tablet Take 1 tablet by mouth.    . topiramate (TOPAMAX) 50 MG tablet Take 50 mg by mouth 3 (three) times daily.    . verapamil (VERELAN PM) 180 MG 24 hr capsule Take 180 mg by mouth at bedtime.     Current Facility-Administered Medications on File Prior to Visit  Medication Dose Route Frequency Provider Last Rate Last Dose  . methylPREDNISolone acetate (DEPO-MEDROL) injection 40 mg  40 mg Intra-articular Once Carole Civil, MD      . methylPREDNISolone acetate (DEPO-MEDROL) injection 40 mg  40 mg Intra-articular Once Carole Civil, MD      . methylPREDNISolone acetate (DEPO-MEDROL) injection 40 mg  40 mg Intra-articular Once Carole Civil, MD            Objective:   Physical Exam Blood pressure (!) 182/100, pulse 76, temperature 98.1 F (36.7 C), height 5\' 7"  (1.702 m), weight 166 lb 8 oz (75.5 kg). Alert and oriented. Skin warm and dry. Oral mucosa is moist.   . Sclera anicteric, conjunctivae is pink.  Thyroid not enlarged. No cervical lymphadenopathy. Lungs clear. Heart regular rate and rhythm.  Abdomen is soft. Bowel sounds are positive. No hepatomegaly. No abdominal masses felt. No tenderness.  No edema to lower extremities.           Assessment & Plan:  Rectal bleeding. ? Hemorrhoidal. Colonic neoplasm needs to be ruled out. Polyp, AVM, ulcer in the differential.  The risks of bleeding, perforation and infection were reviewed with patient. Colonoscopy with propofol.

## 2018-08-27 ENCOUNTER — Encounter (INDEPENDENT_AMBULATORY_CARE_PROVIDER_SITE_OTHER): Payer: Self-pay | Admitting: *Deleted

## 2018-08-27 ENCOUNTER — Other Ambulatory Visit (INDEPENDENT_AMBULATORY_CARE_PROVIDER_SITE_OTHER): Payer: Self-pay | Admitting: Internal Medicine

## 2018-08-27 ENCOUNTER — Telehealth (INDEPENDENT_AMBULATORY_CARE_PROVIDER_SITE_OTHER): Payer: Self-pay | Admitting: *Deleted

## 2018-08-27 DIAGNOSIS — K625 Hemorrhage of anus and rectum: Secondary | ICD-10-CM | POA: Insufficient documentation

## 2018-08-27 MED ORDER — SUPREP BOWEL PREP KIT 17.5-3.13-1.6 GM/177ML PO SOLN: 1.0000 | Freq: Once | ORAL | 0 refills | Status: AC

## 2018-08-27 NOTE — Telephone Encounter (Signed)
Patient needs suprep 

## 2018-09-28 NOTE — Patient Instructions (Signed)
Victoria Bailey  09/28/2018     @PREFPERIOPPHARMACY @   Your procedure is scheduled on  10/09/2018.  Report to [facility] at  1130   A.M.  Call this number if you have problems the morning of surgery:  [phone]   Remember:  Follow the diet and prep instructions given to you by Dr Olevia Perches office.                   Take these medicines the morning of surgery with A SIP OF WATER  Bupropion, zyrtec, desvenlafaxine, nexium, hydrocodone ( if needed), topamax.    Do not wear jewelry, make-up or nail polish.  Do not wear lotions, powders, or perfumes, or deodorant.  Do not shave 48 hours prior to surgery.  Men may shave face and neck.  Do not bring valuables to the hospital.  [facility] is not responsible for any belongings or valuables.  Contacts, dentures or bridgework may not be worn into surgery.  Leave your suitcase in the car.  After surgery it may be brought to your room.  For patients admitted to the hospital, discharge time will be determined by your treatment team.  Patients discharged the day of surgery will not be allowed to drive home.   Name and phone number of your driver:   family Special instructions:  None  Please read over the following fact sheets that you were given. Anesthesia Post-op Instructions and Care and Recovery After Surgery       Colonoscopy, Adult A colonoscopy is an exam to look at the large intestine. It is done to check for problems, such as:  Lumps (tumors).  Growths (polyps).  Swelling (inflammation).  Bleeding.  What happens before the procedure? Eating and drinking Follow instructions from your doctor about eating and drinking. These instructions may include:  A few days before the procedure - follow a low-fiber diet. ? Avoid nuts. ? Avoid seeds. ? Avoid dried fruit. ? Avoid raw fruits. ? Avoid vegetables.  1-3 days before the procedure - follow a clear liquid diet. Avoid liquids that have red or purple  dye. Drink only clear liquids, such as: ? Clear broth or bouillon. ? Black coffee or tea. ? Clear juice. ? Clear soft drinks or sports drinks. ? Gelatin dessert. ? Popsicles.  On the day of the procedure - do not eat or drink anything during the 2 hours before the procedure.  Bowel prep If you were prescribed an oral bowel prep:  Take it as told by your doctor. Starting the day before your procedure, you will need to drink a lot of liquid. The liquid will cause you to poop (have bowel movements) until your poop is almost clear or light green.  If your skin or butt gets irritated from diarrhea, you may: ? Wipe the area with wipes that have medicine in them, such as adult wet wipes with aloe and vitamin E. ? Put something on your skin that soothes the area, such as petroleum jelly.  If you throw up (vomit) while drinking the bowel prep, take a break for up to 60 minutes. Then begin the bowel prep again. If you keep throwing up and you cannot take the bowel prep without throwing up, call your doctor.  General instructions  Ask your doctor about changing or stopping your normal medicines. This is important if you take diabetes medicines or blood thinners.  Plan to have someone take you  home from the hospital or clinic. What happens during the procedure?  An IV tube may be put into one of your veins.  You will be given medicine to help you relax (sedative).  To reduce your risk of infection: ? Your doctors will wash their hands. ? Your anal area will be washed with soap.  You will be asked to lie on your side with your knees bent.  Your doctor will get a long, thin, flexible tube ready. The tube will have a camera and a light on the end.  The tube will be put into your anus.  The tube will be gently put into your large intestine.  Air will be delivered into your large intestine to keep it open. You may feel some pressure or cramping.  The camera will be used to take  photos.  A small tissue sample may be removed from your body to be looked at under a microscope (biopsy). If any possible problems are found, the tissue will be sent to a lab for testing.  If small growths are found, your doctor may remove them and have them checked for cancer.  The tube that was put into your anus will be slowly removed. The procedure may vary among doctors and hospitals. What happens after the procedure?  Your doctor will check on you often until the medicines you were given have worn off.  Do not drive for 24 hours after the procedure.  You may have a small amount of blood in your poop.  You may pass gas.  You may have mild cramps or bloating in your belly (abdomen).  It is up to you to get the results of your procedure. Ask your doctor, or the department performing the procedure, when your results will be ready. This information is not intended to replace advice given to you by your health care provider. Make sure you discuss any questions you have with your health care provider. Document Released: 11/16/2010 Document Revised: 08/14/2016 Document Reviewed: 12/26/2015 Elsevier Interactive Patient Education  2017 [employer]  Colonoscopy, Adult, Care After This sheet gives you information about how to care for yourself after your procedure. Your health care provider may also give you more specific instructions. If you have problems or questions, contact your health care provider. What can I expect after the procedure? After the procedure, it is common to have:  A small amount of blood in your stool for 24 hours after the procedure.  Some gas.  Mild abdominal cramping or bloating.  Follow these instructions at home: General instructions   For the first 24 hours after the procedure: ? Do not drive or use machinery. ? Do not sign important documents. ? Do not drink alcohol. ? Do your regular daily activities at a slower pace than normal. ? Eat soft,  easy-to-digest foods. ? Rest often.  Take over-the-counter or prescription medicines only as told by your health care provider.  It is up to you to get the results of your procedure. Ask your health care provider, or the department performing the procedure, when your results will be ready. Relieving cramping and bloating  Try walking around when you have cramps or feel bloated.  Apply heat to your abdomen as told by your health care provider. Use a heat source that your health care provider recommends, such as a moist heat pack or a heating pad. ? Place a towel between your skin and the heat source. ? Leave the heat on for 20-30 minutes. ?  Remove the heat if your skin turns bright red. This is especially important if you are unable to feel pain, heat, or cold. You may have a greater risk of getting burned. Eating and drinking  Drink enough fluid to keep your urine clear or pale yellow.  Resume your normal diet as instructed by your health care provider. Avoid heavy or fried foods that are hard to digest.  Avoid drinking alcohol for as long as instructed by your health care provider. Contact a health care provider if:  You have blood in your stool 2-3 days after the procedure. Get help right away if:  You have more than a small spotting of blood in your stool.  You pass large blood clots in your stool.  Your abdomen is swollen.  You have nausea or vomiting.  You have a fever.  You have increasing abdominal pain that is not relieved with medicine. This information is not intended to replace advice given to you by your health care provider. Make sure you discuss any questions you have with your health care provider. Document Released: 05/28/2004 Document Revised: 07/08/2016 Document Reviewed: 12/26/2015 Elsevier Interactive Patient Education  2018 [place] Anesthesia is a term that refers to techniques, procedures, and medicines that help a  person stay safe and comfortable during a medical procedure. Monitored anesthesia care, or sedation, is one type of anesthesia. Your anesthesia specialist may recommend sedation if you will be having a procedure that does not require you to be unconscious, such as:  Cataract surgery.  A dental procedure.  A biopsy.  A colonoscopy.  During the procedure, you may receive a medicine to help you relax (sedative). There are three levels of sedation:  Mild sedation. At this level, you may feel awake and relaxed. You will be able to follow directions.  Moderate sedation. At this level, you will be sleepy. You may not remember the procedure.  Deep sedation. At this level, you will be asleep. You will not remember the procedure.  The more medicine you are given, the deeper your level of sedation will be. Depending on how you respond to the procedure, the anesthesia specialist may change your level of sedation or the type of anesthesia to fit your needs. An anesthesia specialist will monitor you closely during the procedure. Let your health care provider know about:  Any allergies you have.  All medicines you are taking, including vitamins, herbs, eye drops, creams, and over-the-counter medicines.  Any use of steroids (by mouth or as a cream).  Any problems you or family members have had with sedatives and anesthetic medicines.  Any blood disorders you have.  Any surgeries you have had.  Any medical conditions you have, such as sleep apnea.  Whether you are pregnant or may be pregnant.  Any use of cigarettes, alcohol, or street drugs. What are the risks? Generally, this is a safe procedure. However, problems may occur, including:  Getting too much medicine (oversedation).  Nausea.  Allergic reaction to medicines.  Trouble breathing. If this happens, a breathing tube may be used to help with breathing. It will be removed when you are awake and breathing on your own.  Heart  trouble.  Lung trouble.  Before the procedure Staying hydrated Follow instructions from your health care provider about hydration, which may include:  Up to 2 hours before the procedure - you may continue to drink clear liquids, such as water, clear fruit juice, black coffee, and plain tea.  Eating and drinking restrictions Follow instructions from your health care provider about eating and drinking, which may include:  8 hours before the procedure - stop eating heavy meals or foods such as meat, fried foods, or fatty foods.  6 hours before the procedure - stop eating light meals or foods, such as toast or cereal.  6 hours before the procedure - stop drinking milk or drinks that contain milk.  2 hours before the procedure - stop drinking clear liquids.  Medicines Ask your health care provider about:  Changing or stopping your regular medicines. This is especially important if you are taking diabetes medicines or blood thinners.  Taking medicines such as aspirin and ibuprofen. These medicines can thin your blood. Do not take these medicines before your procedure if your health care provider instructs you not to.  Tests and exams  You will have a physical exam.  You may have blood tests done to show: ? How well your kidneys and liver are working. ? How well your blood can clot.  General instructions  Plan to have someone take you home from the hospital or clinic.  If you will be going home right after the procedure, plan to have someone with you for 24 hours.  What happens during the procedure?  Your blood pressure, heart rate, breathing, level of pain and overall condition will be monitored.  An IV tube will be inserted into one of your veins.  Your anesthesia specialist will give you medicines as needed to keep you comfortable during the procedure. This may mean changing the level of sedation.  The procedure will be performed. After the procedure  Your blood  pressure, heart rate, breathing rate, and blood oxygen level will be monitored until the medicines you were given have worn off.  Do not drive for 24 hours if you received a sedative.  You may: ? Feel sleepy, clumsy, or nauseous. ? Feel forgetful about what happened after the procedure. ? Have a sore throat if you had a breathing tube during the procedure. ? Vomit. This information is not intended to replace advice given to you by your health care provider. Make sure you discuss any questions you have with your health care provider. Document Released: 07/10/2005 Document Revised: 03/22/2016 Document Reviewed: 02/04/2016 Elsevier Interactive Patient Education  2018 [place], Care After These instructions provide you with information about caring for yourself after your procedure. Your health care provider may also give you more specific instructions. Your treatment has been planned according to current medical practices, but problems sometimes occur. Call your health care provider if you have any problems or questions after your procedure. What can I expect after the procedure? After your procedure, it is common to:  Feel sleepy for several hours.  Feel clumsy and have poor balance for several hours.  Feel forgetful about what happened after the procedure.  Have poor judgment for several hours.  Feel nauseous or vomit.  Have a sore throat if you had a breathing tube during the procedure.  Follow these instructions at home: For at least 24 hours after the procedure:   Do not: ? Participate in activities in which you could fall or become injured. ? Drive. ? Use heavy machinery. ? Drink alcohol. ? Take sleeping pills or medicines that cause drowsiness. ? Make important decisions or sign legal documents. ? Take care of children on your own.  Rest. Eating and drinking  Follow the diet that is recommended by your  health care provider.  If you  vomit, drink water, juice, or soup when you can drink without vomiting.  Make sure you have little or no nausea before eating solid foods. General instructions  Have a responsible adult stay with you until you are awake and alert.  Take over-the-counter and prescription medicines only as told by your health care provider.  If you smoke, do not smoke without supervision.  Keep all follow-up visits as told by your health care provider. This is important. Contact a health care provider if:  You keep feeling nauseous or you keep vomiting.  You feel light-headed.  You develop a rash.  You have a fever. Get help right away if:  You have trouble breathing. This information is not intended to replace advice given to you by your health care provider. Make sure you discuss any questions you have with your health care provider. Document Released: 02/04/2016 Document Revised: 06/05/2016 Document Reviewed: 02/04/2016 Elsevier Interactive Patient Education  [employer].

## 2018-10-05 ENCOUNTER — Encounter (HOSPITAL_COMMUNITY)
Admission: RE | Admit: 2018-10-05 | Discharge: 2018-10-05 | Disposition: A | Discharge status: Home / Self Care | Payer: Medicare Other | Source: Ambulatory Visit | Attending: Internal Medicine | Admitting: Internal Medicine

## 2018-10-05 ENCOUNTER — Other Ambulatory Visit: Payer: Self-pay

## 2018-10-05 ENCOUNTER — Encounter (HOSPITAL_COMMUNITY): Payer: Self-pay

## 2018-10-05 DIAGNOSIS — K625 Hemorrhage of anus and rectum: Secondary | ICD-10-CM | POA: Diagnosis not present

## 2018-10-05 DIAGNOSIS — Z01818 Encounter for other preprocedural examination: Secondary | ICD-10-CM | POA: Insufficient documentation

## 2018-10-05 HISTORY — DX: Anxiety disorder, unspecified: F41.9

## 2018-10-05 HISTORY — DX: Major depressive disorder, single episode, unspecified: F32.9

## 2018-10-05 HISTORY — DX: Other chronic pain: G89.29

## 2018-10-05 HISTORY — DX: Sleep apnea, unspecified: G47.30

## 2018-10-05 HISTORY — DX: Gastro-esophageal reflux disease without esophagitis: K21.9

## 2018-10-05 HISTORY — DX: Depression, unspecified: F32.A

## 2018-10-05 HISTORY — DX: Polyneuropathy, unspecified: G62.9

## 2018-10-05 LAB — BASIC METABOLIC PANEL
Anion gap: 7 (ref 5–15)
BUN: 10 mg/dL (ref 8–23)
CO2: 22 mmol/L (ref 22–32)
Calcium: 9 mg/dL (ref 8.9–10.3)
Chloride: 112 mmol/L — ABNORMAL HIGH (ref 98–111)
Creatinine, Ser: 1.04 mg/dL — ABNORMAL HIGH (ref 0.44–1.00)
GFR calc Af Amer: >60 mL/min (ref >60)
GFR calc non Af Amer: 58 mL/min — ABNORMAL LOW (ref >60)
Glucose, Bld: 95 mg/dL (ref 70–99)
Potassium: 3.3 mmol/L — ABNORMAL LOW (ref 3.5–5.1)
Sodium: 141 mmol/L (ref 135–145)

## 2018-10-05 LAB — CBC WITH DIFFERENTIAL/PLATELET
Abs Immature Granulocytes: 0 10*3/uL (ref 0.00–0.07)
Basophils Absolute: 0 10*3/uL (ref 0.0–0.1)
Basophils Relative: 1 %
EOS ABS: 0.1 10*3/uL (ref 0.0–0.5)
Eosinophils Relative: 3 %
HCT: 41.4 % (ref 36.0–46.0)
Hemoglobin: 12.9 g/dL (ref 12.0–15.0)
IMMATURE GRANULOCYTES: 0 %
Lymphocytes Relative: 17 %
Lymphs Abs: 0.7 10*3/uL (ref 0.7–4.0)
MCH: 28.7 pg (ref 26.0–34.0)
MCHC: 31.2 g/dL (ref 30.0–36.0)
MCV: 92.2 fL (ref 80.0–100.0)
Monocytes Absolute: 0.3 10*3/uL (ref 0.1–1.0)
Monocytes Relative: 7 %
Neutro Abs: 2.8 10*3/uL (ref 1.7–7.7)
Neutrophils Relative %: 72 %
Platelets: 233 10*3/uL (ref 150–400)
RBC: 4.49 MIL/uL (ref 3.87–5.11)
RDW: 13.2 % (ref 11.5–15.5)
WBC: 3.8 10*3/uL — ABNORMAL LOW (ref 4.0–10.5)
nRBC: 0 % (ref 0.0–0.2)

## 2018-10-09 ENCOUNTER — Ambulatory Visit (HOSPITAL_COMMUNITY): Payer: Medicare Other | Admitting: Anesthesiology

## 2018-10-09 ENCOUNTER — Other Ambulatory Visit: Payer: Self-pay

## 2018-10-09 ENCOUNTER — Encounter (HOSPITAL_COMMUNITY): Payer: Self-pay | Admitting: *Deleted

## 2018-10-09 ENCOUNTER — Ambulatory Visit (HOSPITAL_COMMUNITY)
Admission: RE | Admit: 2018-10-09 | Discharge: 2018-10-09 | Disposition: A | Discharge status: Home / Self Care | Payer: Medicare Other | Source: Ambulatory Visit | Attending: Internal Medicine | Admitting: Internal Medicine

## 2018-10-09 ENCOUNTER — Encounter (HOSPITAL_COMMUNITY)
Admission: RE | Disposition: A | Discharge status: Home / Self Care | Payer: Self-pay | Source: Ambulatory Visit | Attending: Internal Medicine

## 2018-10-09 DIAGNOSIS — E1140 Type 2 diabetes mellitus with diabetic neuropathy, unspecified: Secondary | ICD-10-CM | POA: Insufficient documentation

## 2018-10-09 DIAGNOSIS — K921 Melena: Secondary | ICD-10-CM | POA: Diagnosis present

## 2018-10-09 DIAGNOSIS — K625 Hemorrhage of anus and rectum: Secondary | ICD-10-CM | POA: Insufficient documentation

## 2018-10-09 DIAGNOSIS — Z882 Allergy status to sulfonamides status: Secondary | ICD-10-CM | POA: Insufficient documentation

## 2018-10-09 DIAGNOSIS — Z881 Allergy status to other antibiotic agents status: Secondary | ICD-10-CM | POA: Insufficient documentation

## 2018-10-09 DIAGNOSIS — Z885 Allergy status to narcotic agent status: Secondary | ICD-10-CM | POA: Diagnosis not present

## 2018-10-09 DIAGNOSIS — K589 Irritable bowel syndrome without diarrhea: Secondary | ICD-10-CM | POA: Diagnosis not present

## 2018-10-09 DIAGNOSIS — F329 Major depressive disorder, single episode, unspecified: Secondary | ICD-10-CM | POA: Diagnosis not present

## 2018-10-09 DIAGNOSIS — K648 Other hemorrhoids: Secondary | ICD-10-CM | POA: Diagnosis not present

## 2018-10-09 DIAGNOSIS — K644 Residual hemorrhoidal skin tags: Secondary | ICD-10-CM | POA: Insufficient documentation

## 2018-10-09 DIAGNOSIS — D125 Benign neoplasm of sigmoid colon: Secondary | ICD-10-CM | POA: Insufficient documentation

## 2018-10-09 DIAGNOSIS — Z91040 Latex allergy status: Secondary | ICD-10-CM | POA: Diagnosis not present

## 2018-10-09 DIAGNOSIS — K219 Gastro-esophageal reflux disease without esophagitis: Secondary | ICD-10-CM | POA: Insufficient documentation

## 2018-10-09 DIAGNOSIS — Z79899 Other long term (current) drug therapy: Secondary | ICD-10-CM | POA: Insufficient documentation

## 2018-10-09 DIAGNOSIS — F419 Anxiety disorder, unspecified: Secondary | ICD-10-CM | POA: Insufficient documentation

## 2018-10-09 DIAGNOSIS — Z905 Acquired absence of kidney: Secondary | ICD-10-CM | POA: Insufficient documentation

## 2018-10-09 DIAGNOSIS — G4730 Sleep apnea, unspecified: Secondary | ICD-10-CM | POA: Diagnosis not present

## 2018-10-09 DIAGNOSIS — I10 Essential (primary) hypertension: Secondary | ICD-10-CM | POA: Insufficient documentation

## 2018-10-09 DIAGNOSIS — G8929 Other chronic pain: Secondary | ICD-10-CM | POA: Diagnosis not present

## 2018-10-09 HISTORY — PX: POLYPECTOMY: SHX5525

## 2018-10-09 HISTORY — PX: COLONOSCOPY WITH PROPOFOL: SHX5780

## 2018-10-09 SURGERY — COLONOSCOPY WITH PROPOFOL
Anesthesia: General

## 2018-10-09 MED ORDER — PROMETHAZINE HCL 25 MG/ML IJ SOLN: 6.2500 mg | INTRAMUSCULAR | Status: DC | PRN

## 2018-10-09 MED ORDER — BENEFIBER DRINK MIX PO PACK: 4.0000 g | PACK | Freq: Every day | ORAL | Status: DC

## 2018-10-09 MED ORDER — PROPOFOL 10 MG/ML IV BOLUS
INTRAVENOUS | Status: AC
  Filled 2018-10-09: qty 20

## 2018-10-09 MED ORDER — MIDAZOLAM HCL 2 MG/2ML IJ SOLN: 0.5000 mg | Freq: Once | INTRAMUSCULAR | Status: DC | PRN

## 2018-10-09 MED ORDER — PROPOFOL 10 MG/ML IV BOLUS
INTRAVENOUS | Status: AC
  Filled 2018-10-09: qty 40

## 2018-10-09 MED ORDER — STERILE WATER FOR IRRIGATION IR SOLN
Status: DC | PRN
  Administered 2018-10-09: 1.5 mL

## 2018-10-09 MED ORDER — CHLORHEXIDINE GLUCONATE CLOTH 2 % EX PADS: 6.0000 | MEDICATED_PAD | Freq: Once | CUTANEOUS | Status: DC

## 2018-10-09 MED ORDER — LACTATED RINGERS IV SOLN
INTRAVENOUS | Status: DC
  Administered 2018-10-09: 13:00:00 via INTRAVENOUS

## 2018-10-09 MED ORDER — PROPOFOL 500 MG/50ML IV EMUL
INTRAVENOUS | Status: DC | PRN
  Administered 2018-10-09: 150 ug/kg/min via INTRAVENOUS

## 2018-10-09 MED ORDER — PROPOFOL 10 MG/ML IV BOLUS
INTRAVENOUS | Status: DC | PRN
  Administered 2018-10-09: 30 mg via INTRAVENOUS

## 2018-10-09 MED ORDER — GLYCOPYRROLATE PF 0.2 MG/ML IJ SOSY
PREFILLED_SYRINGE | INTRAMUSCULAR | Status: AC
  Filled 2018-10-09: qty 1

## 2018-10-09 MED ORDER — HYDROCORTISONE ACETATE 25 MG RE SUPP: 25.0000 mg | Freq: Every day | RECTAL | 1 refills | Status: DC

## 2018-10-09 NOTE — Discharge Instructions (Signed)
No aspirin or NSAIDs for 24 hours. Resume usual medications as before. Anusol HC.  Suppository 1 per rectum daily at bedtime for 2 weeks. Benefiber 4 g mouth daily at bedtime. Resume usual diet. Physician will call with biopsy result.   Colonoscopy, Adult, Care After This sheet gives you information about how to care for yourself after your procedure. Your health care provider may also give you more specific instructions. If you have problems or questions, contact your health care provider.  Dr. Laural Golden:  [phone].  After hours and weekends, call the hospital and have the GI doctor on call paged.  They will call you back.  What can I expect after the procedure? After the procedure, it is common to have:  A small amount of blood in your stool for 24 hours after the procedure.  Some gas.  Mild abdominal cramping or bloating.  Follow these instructions at home: General instructions   For the first 24 hours after the procedure: ? Do not drive or use machinery. ? Do not sign important documents. ? Do not drink alcohol. ? Rest often.  Take over-the-counter or prescription medicines only as told by your health care provider.  It is up to you to get the results of your procedure. Ask your health care provider, or the department performing the procedure, when your results will be ready. Relieving cramping and bloating  Try walking around when you have cramps or feel bloated.    Drink enough fluid to keep your urine clear or pale yellow.  Resume your normal diet as instructed by your health care provider.  Avoid drinking alcohol for as long as instructed by your health care provider.   Contact a health care provider if:  You have blood in your stool 2-3 days after the procedure. Get help right away if:  You have more than a small spotting of blood in your stool.  You pass large blood clots in your stool.  Your abdomen is swollen.  You have nausea or vomiting.  You  have a fever.  You have increasing abdominal pain that is not relieved with medicine. This information is not intended to replace advice given to you by your health care provider. Make sure you discuss any questions you have with your health care provider. Document Released: 05/28/2004 Document Revised: 07/08/2016 Document Reviewed: 12/26/2015 Elsevier Interactive Patient Education  [employer].

## 2018-10-09 NOTE — Op Note (Signed)
[facility] Patient Name: Victoria Bailey Procedure Date: 10/09/2018 11:30 AM MRN: [id number] Date of Birth: January 31, 1956 Attending MD: Hildred Laser , MD CSN: [id number] Age: 62 Admit Type: Outpatient Procedure:                Colonoscopy Indications:              Hematochezia Providers:                Hildred Laser, MD, Gerome Sam, RN, Aram Candela Referring MD:             Kennieth Rad, MD Medicines:                Propofol per Anesthesia Complications:            No immediate complications. Estimated Blood Loss:     Estimated blood loss was minimal. Procedure:                Pre-Anesthesia Assessment:                           - Prior to the procedure, a History and Physical                            was performed, and patient medications and                            allergies were reviewed. The patient's tolerance of                            previous anesthesia was also reviewed. The risks                            and benefits of the procedure and the sedation                            options and risks were discussed with the patient.                            All questions were answered, and informed consent                            was obtained. Prior Anticoagulants: The patient has                            taken no previous anticoagulant or antiplatelet                            agents. ASA Grade Assessment: II - A patient with                            mild systemic disease. After reviewing the risks                            and benefits, the patient was deemed in  satisfactory condition to undergo the procedure.                           After obtaining informed consent, the colonoscope                            was passed under direct vision. Throughout the                            procedure, the patient's blood pressure, pulse, and                            oxygen saturations were monitored continuously. The                    [id number]-[id number] ([id number]) scope was introduced through                            the and advanced to the the cecum, identified by                            appendiceal orifice and ileocecal valve. The                            colonoscopy was performed without difficulty. The                            patient tolerated the procedure well. The quality                            of the bowel preparation was adequate. The                            ileocecal valve, appendiceal orifice, and rectum                            were photographed. Scope In: 38:75:64 PM Scope Out: 1:08:06 PM Scope Withdrawal Time: 0 hours 11 minutes 54 seconds  Total Procedure Duration: 0 hours 19 minutes 53 seconds  Findings:      The perianal and digital rectal examinations were normal.      A 5 mm polyp was found in the proximal sigmoid colon. The polyp was       sessile. The polyp was removed with a cold snare. Resection and       retrieval were complete. The pathology specimen was placed into Bottle       Number 1.      The exam was otherwise normal throughout the examined colon.      External and internal hemorrhoids were found during retroflexion. The       hemorrhoids were medium-sized. Impression:               - One 5 mm polyp in the proximal sigmoid colon,                            removed with a cold snare. Resected and retrieved.                           -  External and internal hemorrhoids. Moderate Sedation:      Per Anesthesia Care Recommendation:           - Patient has a contact number available for                            emergencies. The signs and symptoms of potential                            delayed complications were discussed with the                            patient. Return to normal activities tomorrow.                            Written discharge instructions were provided to the                            patient.                           - Resume previous  diet today.                           - Continue present medications.                           - Use Benefiber 4 g PO daily.                           - Use hydrocortisone suppository 25 mg 1 per rectum                            daily for 2 weeks.                           - Await pathology results.                           - Repeat colonoscopy is recommended. The                            colonoscopy date will be determined after pathology                            results from today's exam become available for                            review. Procedure Code(s):        --- Professional ---                           [phone], Colonoscopy, flexible; with removal of                            tumor(s), polyp(s), or other lesion(s) by snare  technique Diagnosis Code(s):        --- Professional ---                           D12.5, Benign neoplasm of sigmoid colon                           K64.8, Other hemorrhoids                           K92.1, Melena (includes Hematochezia) CPT copyright 2018 American [employer] Association. All rights reserved. The codes documented in this report are preliminary and upon coder review may  be revised to meet current compliance requirements. Hildred Laser, MD Hildred Laser, MD 10/09/2018 1:17:23 PM This report has been signed electronically. Number of Addenda: 0

## 2018-10-09 NOTE — Anesthesia Postprocedure Evaluation (Signed)
Anesthesia Post Note  Patient: Victoria Bailey  Procedure(s) Performed: COLONOSCOPY WITH PROPOFOL (N/A ) POLYPECTOMY  Patient location during evaluation: PACU Anesthesia Type: General Level of consciousness: awake and alert and oriented Pain management: pain level controlled Vital Signs Assessment: post-procedure vital signs reviewed and stable Respiratory status: spontaneous breathing Cardiovascular status: stable Postop Assessment: no apparent nausea or vomiting Anesthetic complications: no     Last Vitals:  Vitals:   10/09/18 1149  BP: 137/85  Pulse: 80  Resp: 16  Temp: 36.8 C  SpO2: 94%    Last Pain:  Vitals:   10/09/18 1242  TempSrc:   PainSc: 4                  Nysir Fergusson A

## 2018-10-09 NOTE — H&P (Signed)
Victoria Bailey is an 62 y.o. female.   Chief Complaint: Patient is here for colonoscopy. \HPI: Patient is 62 year old Caucasian female who presents with 1 year history of intermittent rectal bleeding which only occurs with her bowel movements.  She has irregular bowel movements.  She either has diarrhea or constipation.  She has been diagnosed with IBS.  Last colonoscopy was in 2010 and she had mild nonspecific colitis.  This exam was performed by Dr. Algis Greenhouse of [facility].  She says her memory is not very good and she does not remember if she was treated with any medication. Her serum potassium was 3.3.  She does not take diuretics.  Serum potassium is being rechecked. Family history significant for CRC and maternal grandmother who was in her 34s when she died.  She had other health issues including diabetes mellitus.  Past Medical History:  Diagnosis Date  . Anxiety   . Chronic pain   . Depression   . Essential hypertension, benign 08/26/2018  . Foot drop   . GERD (gastroesophageal reflux disease)   . Migraine    miograines, takes botox as well for these  . Neuropathy   . Osteoporosis   . 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 Sleep apnea    cannot tolerate CPAP, "causes migraines to be worse" PCP aware.    Past Surgical History:  Procedure Laterality Date  . ABDOMINAL HYSTERECTOMY    . back stimulator    . BACK SURGERY     laminectomy and fusion X2 surgeries  . CHOLECYSTECTOMY    . HIP SURGERY     rt bipolar 10.26.2011  . JOINT REPLACEMENT    . 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 PLANTAR FASCIA RELEASE Right     History reviewed. No pertinent family history. Social History:  reports that she has never smoked. She has never used smokeless tobacco. She reports that she does not drink alcohol or use drugs.  Allergies:  Allergies  Allergen Reactions  . Latex Other (See Comments)    Reaction unknown-per patient, MD confirmed the  allergy  . Oxycodone Hcl Nausea And Vomiting  . Sulfa Drugs Cross Reactors Nausea And Vomiting  . Doxycycline Hives and Rash    Facility-Administered Medications Prior to Admission  Medication Dose Route Frequency Provider Last Rate Last Dose  . methylPREDNISolone acetate (DEPO-MEDROL) injection 40 mg  40 mg Intra-articular Once Carole Civil, MD      . methylPREDNISolone acetate (DEPO-MEDROL) injection 40 mg  40 mg Intra-articular Once Carole Civil, MD      . methylPREDNISolone acetate (DEPO-MEDROL) injection 40 mg  40 mg Intra-articular Once Carole Civil, MD       Medications Prior to Admission  Medication Sig Dispense Refill  . albuterol (PROVENTIL HFA;VENTOLIN HFA) 108 (90 Base) MCG/ACT inhaler Inhale 2 puffs into the lungs every 6 (six) hours as needed for wheezing or shortness of breath.    . ALPRAZolam (XANAX) 0.5 MG tablet Take 0.5 mg by mouth 2 (two) times daily as needed for anxiety.     Marland Kitchen amitriptyline (ELAVIL) 10 MG tablet Take 10 mg by mouth at bedtime.    Marland Kitchen azelastine (ASTELIN) 0.1 % nasal spray Place 2 sprays into both nostrils daily as needed for rhinitis.     Marland Kitchen buPROPion (WELLBUTRIN XL) 150 MG 24 hr tablet Take 150 mg by mouth 2 (two) times daily.  1  . Calcium Carb-Cholecalciferol (CALCIUM  600/VITAMIN D3 PO) Take 1 tablet by mouth 2 (two) times daily.    . cetirizine (ZYRTEC) 10 MG tablet Take 10 mg by mouth daily as needed for allergies.     . Cholecalciferol (VITAMIN D3) 50 MCG (2000 UT) TABS Take 2,000 Units by mouth daily.    Marland Kitchen desvenlafaxine (PRISTIQ) 50 MG 24 hr tablet Take 50 mg by mouth daily.     Marland Kitchen esomeprazole (NEXIUM) 40 MG capsule Take 40 mg by mouth daily.     Marland Kitchen HYDROcodone-acetaminophen (NORCO/VICODIN) 5-325 MG tablet Take 1 tablet by mouth 2 (two) times daily as needed for moderate pain.     . Prenatal Vit-Fe Fumarate-FA (PRENATAL PO) Take 1 tablet by mouth daily.    . QUEtiapine (SEROQUEL) 50 MG tablet Take 50 mg by mouth at bedtime.     . topiramate (TOPAMAX) 50 MG tablet Take 50 mg by mouth 2 (two) times daily.     . verapamil (CALAN-SR) 240 MG CR tablet Take 240 mg by mouth daily.  3  . docusate sodium (COLACE) 100 MG capsule Take 100 mg by mouth daily.     . naratriptan (AMERGE) 2.5 MG tablet Take 2.5 mg by mouth daily as needed for migraine.   11    No results found for this or any previous visit (from the past 48 hour(s)). No results found.  ROS  Blood pressure 137/85, pulse 80, temperature 98.3 F (36.8 C), temperature source Oral, resp. rate 16, height 5\' 7"  (1.702 m), weight 75.2 kg, SpO2 94 %. Physical Exam  Constitutional: She appears well-developed and well-nourished.  HENT:  Mouth/Throat: Oropharynx is clear and moist.  Eyes: Conjunctivae are normal. No scleral icterus.  Neck: No thyromegaly present.  Cardiovascular: Normal rate, regular rhythm and normal heart sounds.  No murmur heard. Respiratory: Effort normal and breath sounds normal.  GI:  Abdomen is symmetrical with lower midline scar.  On palpation is soft and nontender with organomegaly or masses.  Neurological: She is alert.  Skin: Skin is warm and dry.     Assessment/Plan Chronic hematochezia. Diagnostic colonoscopy.  Hildred Laser, MD 10/09/2018, 12:21 PM

## 2018-10-09 NOTE — Anesthesia Preprocedure Evaluation (Signed)
Anesthesia Evaluation  Patient identified by MRN, date of birth, ID band Patient awake    Reviewed: Allergy & Precautions, NPO status , Patient's Chart, lab work & pertinent test results  Airway Mallampati: II  TM Distance: >3 FB Neck ROM: Full    Dental no notable dental hx. (+) Teeth Intact   Pulmonary sleep apnea ,  [place] with CPAP b/c of migraines    Pulmonary exam normal breath sounds clear to auscultation       Cardiovascular Exercise Tolerance: Poor hypertension, Pt. on medications negative cardio ROS Normal cardiovascular examI Rhythm:Regular Rate:Normal  Limited ET -due to LB issues and weakness    Neuro/Psych  Headaches, Anxiety Depression  Neuromuscular disease negative psych ROS   GI/Hepatic Neg liver ROS, GERD  Medicated and Controlled,  Endo/Other  negative endocrine ROS  Renal/GU negative Renal ROSOne kidney  negative genitourinary   Musculoskeletal negative musculoskeletal ROS (+)   Abdominal   Peds negative pediatric ROS (+)  Hematology negative hematology ROS (+)   Anesthesia Other Findings   Reproductive/Obstetrics negative OB ROS                             Anesthesia Physical Anesthesia Plan  ASA: II  Anesthesia Plan: General   Post-op Pain Management:    Induction: Intravenous  PONV Risk Score and Plan:   Airway Management Planned: Simple Face Mask and Nasal Cannula  Additional Equipment:   Intra-op Plan:   Post-operative Plan:   Informed Consent: I have reviewed the patients History and Physical, chart, labs and discussed the procedure including the risks, benefits and alternatives for the proposed anesthesia with the patient or authorized representative who has indicated his/her understanding and acceptance.   Dental advisory given  Plan Discussed with: CRNA  Anesthesia Plan Comments:         Anesthesia Quick Evaluation

## 2018-10-09 NOTE — Anesthesia Procedure Notes (Signed)
Procedure Name: MAC Date/Time: 10/09/2018 12:42 PM Performed by: Andree Elk Azzure Garabedian A, CRNA Pre-anesthesia Checklist: Patient identified, Emergency Drugs available, Suction available, Timeout performed and Patient being monitored Patient Re-evaluated:Patient Re-evaluated prior to induction Oxygen Delivery Method: Non-rebreather mask

## 2018-10-09 NOTE — Transfer of Care (Signed)
Immediate Anesthesia Transfer of Care Note  Patient: Victoria Bailey  Procedure(s) Performed: COLONOSCOPY WITH PROPOFOL (N/A ) POLYPECTOMY  Patient Location: PACU  Anesthesia Type:General  Level of Consciousness: awake, alert , oriented and patient cooperative  Airway & Oxygen Therapy: Patient Spontanous Breathing  Post-op Assessment: Report given to RN and Post -op Vital signs reviewed and stable  Post vital signs: Reviewed and stable  Last Vitals:  Vitals Value Taken Time  BP 121/88 10/09/2018  1:12 PM  Temp    Pulse 91 10/09/2018  1:15 PM  Resp 17 10/09/2018  1:15 PM  SpO2 100 % 10/09/2018  1:15 PM  Vitals shown include unvalidated device data.  Last Pain:  Vitals:   10/09/18 1242  TempSrc:   PainSc: 4       Patients Stated Pain Goal: 5 (09/81/19 1478)  Complications: No apparent anesthesia complications

## 2018-10-14 ENCOUNTER — Encounter (HOSPITAL_COMMUNITY): Payer: Self-pay | Admitting: Internal Medicine

## 2018-10-27 ENCOUNTER — Emergency Department (HOSPITAL_COMMUNITY)
Admission: EM | Admit: 2018-10-27 | Discharge: 2018-10-27 | Disposition: A | Discharge status: Home / Self Care | Payer: Medicare Other | Attending: Emergency Medicine | Admitting: Emergency Medicine

## 2018-10-27 ENCOUNTER — Other Ambulatory Visit: Payer: Self-pay

## 2018-10-27 ENCOUNTER — Encounter (HOSPITAL_COMMUNITY): Payer: Self-pay | Admitting: *Deleted

## 2018-10-27 ENCOUNTER — Emergency Department (HOSPITAL_COMMUNITY): Payer: Medicare Other

## 2018-10-27 DIAGNOSIS — Z79899 Other long term (current) drug therapy: Secondary | ICD-10-CM | POA: Diagnosis not present

## 2018-10-27 DIAGNOSIS — I10 Essential (primary) hypertension: Secondary | ICD-10-CM | POA: Diagnosis not present

## 2018-10-27 DIAGNOSIS — M79642 Pain in left hand: Secondary | ICD-10-CM | POA: Diagnosis present

## 2018-10-27 NOTE — ED Provider Notes (Signed)
[facility] EMERGENCY DEPARTMENT Provider Note   CSN: [id number] Arrival date & time: 10/27/18  1301     History   Chief Complaint Chief Complaint  Patient presents with  . Hand Pain    HPI Victoria Bailey is a 62 y.o. female who is right-handed who presents emergency department today for hand pain.  Patient reports that she had a fall on an outstretched hand on 12/7 after tripping secondary to her dropfoot.  Patient reports that since that time she has been having pain at the base of her thumb that she describes as a constant, achy pain that is worse with range of motion.  Patient reports that as time is gone on she feels she has decreased strength in her thumb.  She also notes pain at the base of her fifth metacarpal that extends along the pinky.  Patient denies any skin changes.  No fever.  No joint swelling.  She reports intermittent paresthesias of the hand that do not follow a nerve distribution.  Patient reports that she has hydrocodone for chronic back pain she has been taking that has helped her with her symptoms but as if been ongoing for almost 1 month she wanted to make sure that she was not impeding the healing process but not getting evaluated which is why she presented here today.  HPI  Past Medical History:  Diagnosis Date  . Anxiety   . Chronic pain   . Depression   . Essential hypertension, benign 08/26/2018  . Foot drop   . GERD (gastroesophageal reflux disease)   . Migraine    miograines, takes botox as well for these  . Neuropathy   . Osteoporosis   . 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 Sleep apnea    cannot tolerate CPAP, "causes migraines to be worse" PCP aware.    Patient Active Problem List   Diagnosis Date Noted  . Rectal bleeding 08/27/2018  . Essential hypertension, benign 08/26/2018  . Chronic pain syndrome 09/12/2014  . H/O spinal fusion 09/12/2014  . Tendinitis of right hip flexor 09/12/2014  . Status post right hip  replacement 09/12/2014  . S/P hip replacement 02/20/2012  . Pain, joint, hip, right 05/14/2011  . Greater trochanteric bursitis 05/14/2011  . Pain, joint, hip 04/02/2011  . 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 CLOSED FRACTURE UNSPECIFIED PART NECK FEMUR 09/03/2010    Past Surgical History:  Procedure Laterality Date  . ABDOMINAL HYSTERECTOMY    . back stimulator    . BACK SURGERY     laminectomy and fusion X2 surgeries  . CHOLECYSTECTOMY    . COLONOSCOPY WITH PROPOFOL N/A 10/09/2018   Procedure: COLONOSCOPY WITH PROPOFOL;  Surgeon: Rogene Houston, MD;  Location: [facility] ENDO SUITE;  Service: Endoscopy;  Laterality: N/A;  1:05  . HIP SURGERY     rt bipolar 10.26.2011  . JOINT REPLACEMENT    . 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 PLANTAR FASCIA RELEASE Right   . POLYPECTOMY  10/09/2018   Procedure: POLYPECTOMY;  Surgeon: Rogene Houston, MD;  Location: [facility] ENDO SUITE;  Service: Endoscopy;;  (colon)     OB History   No obstetric history on file.      Home Medications    Prior to Admission medications   Medication Sig Start Date End Date Taking? Authorizing Provider  albuterol (PROVENTIL HFA;VENTOLIN HFA) 108 (90 Base) MCG/ACT inhaler Inhale 2 puffs into the lungs every 6 (six) hours  as needed for wheezing or shortness of breath.    [provider]  ALPRAZolam Duanne Moron) 0.5 MG tablet Take 0.5 mg by mouth 2 (two) times daily as needed for anxiety.     [provider]  amitriptyline (ELAVIL) 10 MG tablet Take 10 mg by mouth at bedtime.    [provider]  azelastine (ASTELIN) 0.1 % nasal spray Place 2 sprays into both nostrils daily as needed for rhinitis.     [provider]  buPROPion (WELLBUTRIN XL) 150 MG 24 hr tablet Take 150 mg by mouth 2 (two) times daily. 09/05/18   [provider]  Calcium Carb-Cholecalciferol (CALCIUM 600/VITAMIN D3 PO) Take 1 tablet by mouth 2 (two) times daily.    [provider]  cetirizine  (ZYRTEC) 10 MG tablet Take 10 mg by mouth daily as needed for allergies.     [provider]  Cholecalciferol (VITAMIN D3) 50 MCG (2000 UT) TABS Take 2,000 Units by mouth daily.    [provider]  desvenlafaxine (PRISTIQ) 50 MG 24 hr tablet Take 50 mg by mouth daily.     [provider]  docusate sodium (COLACE) 100 MG capsule Take 100 mg by mouth daily.     [provider]  esomeprazole (NEXIUM) 40 MG capsule Take 40 mg by mouth daily.     [provider]  HYDROcodone-acetaminophen (NORCO/VICODIN) 5-325 MG tablet Take 1 tablet by mouth 2 (two) times daily as needed for moderate pain.     [provider]  hydrocortisone (ANUSOL-HC) 25 MG suppository Place 1 suppository (25 mg total) rectally at bedtime. 10/09/18   Rogene Houston, MD  naratriptan (AMERGE) 2.5 MG tablet Take 2.5 mg by mouth daily as needed for migraine.  08/25/18   [provider]  Prenatal Vit-Fe Fumarate-FA (PRENATAL PO) Take 1 tablet by mouth daily.    [provider]  QUEtiapine (SEROQUEL) 50 MG tablet Take 50 mg by mouth at bedtime.    [provider]  topiramate (TOPAMAX) 50 MG tablet Take 50 mg by mouth 2 (two) times daily.     [provider]  verapamil (CALAN-SR) 240 MG CR tablet Take 240 mg by mouth daily. 08/25/18   [provider]  Wheat Dextrin (BENEFIBER DRINK MIX) PACK Take 4 g by mouth at bedtime. 10/09/18   Rogene Houston, MD    Family History History reviewed. No pertinent family history.  Social History Social History   Tobacco Use  . Smoking status: Never Smoker  . Smokeless tobacco: Never Used  Substance Use Topics  . Alcohol use: No  . Drug use: No     Allergies   Latex; Oxycodone hcl; Sulfa drugs cross reactors; and Doxycycline   Review of Systems Review of Systems  Constitutional: Negative for fever.  Gastrointestinal: Negative for nausea and vomiting.  Musculoskeletal: Positive for  arthralgias. Negative for joint swelling, neck pain and neck stiffness.  Skin: Negative for rash and wound.  Neurological: Positive for weakness and numbness.     Physical Exam Updated Vital Signs BP 122/79   Pulse 70   Temp 97.8 F (36.6 C) (Oral)   Resp 18   Ht 5\' 7"  (1.702 m)   Wt 73.5 kg   SpO2 95%   BMI 25.37 kg/m   Physical Exam Vitals signs and nursing note reviewed.  Constitutional:      Appearance: She is well-developed.  HENT:     Head: Normocephalic and atraumatic.  Right Ear: External ear normal.     Left Ear: External ear normal.  Eyes:     General: No scleral icterus.       Right eye: No discharge.        Left eye: No discharge.     Conjunctiva/sclera: Conjunctivae normal.  Cardiovascular:     Pulses:          Radial pulses are 2+ on the right side.  Pulmonary:     Effort: Pulmonary effort is normal. No respiratory distress.  Musculoskeletal:       Hands:     Comments: Left hand: No gross deformities, skin intact. Fingers appear normal. No TTP over flexor sheath. TTP over areas indicated in diagram. + snuffbox TTP. Finger adduction/abduction intact with 5/5 strength.  Thumb opposition intact but patient reports pain. Active and resisted ROM to flexion/extension at wrist, MCP, PIP and DIP of all fingers.  Strength of the first digit is decreased secondary to pain.  FDS/FDP intact. Radial artery 2+ with <2sec cap refill. SILT in M/U/R distributions. Grip 5/5 strength.  Negative Finkelstein's test.  Skin:    Coloration: Skin is not pale.  Neurological:     Mental Status: She is alert.     Sensory: Sensation is intact.      ED Treatments / Results  Labs (all labs ordered are listed, but only abnormal results are displayed) Labs Reviewed - No data to display  EKG None  Radiology Dg Wrist Complete Left  Result Date: 10/27/2018 CLINICAL DATA:  Left wrist pain after fall last month. EXAM: LEFT WRIST - COMPLETE 3+ VIEW COMPARISON:  None. FINDINGS:  There is no evidence of fracture or dislocation. There is no evidence of arthropathy or other focal bone abnormality. Soft tissues are unremarkable. IMPRESSION: Negative. Electronically Signed   By: Marijo Conception, M.D.   On: 10/27/2018 14:20   Dg Hand Complete Left  Result Date: 10/27/2018 CLINICAL DATA:  Left hand pain after fall last month. EXAM: LEFT HAND - COMPLETE 3+ VIEW COMPARISON:  None. FINDINGS: There is no evidence of fracture or dislocation. There is no evidence of arthropathy or other focal bone abnormality. Soft tissues are unremarkable. IMPRESSION: Negative. Electronically Signed   By: Marijo Conception, M.D.   On: 10/27/2018 14:18    Procedures Procedures (including critical care time)  Medications Ordered in ED Medications - No data to display   Initial Impression / Assessment and Plan / ED Course  I have reviewed the triage vital signs and the nursing notes.  Pertinent labs & imaging results that were available during my care of the patient were reviewed by me and considered in my medical decision making (see chart for details).     62 year old female who is right-hand dominant presents with left hand pain over for the last several weeks.  Patient reports fall on outstretched hand secondary to mechanical fall on 12/7.  Patient has been experiencing pain since that time that is at the base of her thumb and she reports some weakness with thumb opposition as well.  On exam patient is with tenderness at the base of the fifth metacarpal as well as over the snuffbox.  Thumb opposition is intact however patient does have slightly decreased strength secondary to reported pain with movement.  X-rays ordered including scaphoid dominant view to evaluate patient's injury.  She is neurovascular intact and compartments are soft.  There is no skin changes.  No Kanavel's signs on exam. No evidence of  infection. Pt deferred pain medication here in the department.    Patient's x-rays  reviewed.  There is no evidence of fracture or dislocation.  Plan for thumb spica and hand specialist follow-up.  Dr. Grandville Silos is on call.  Will place consult for further recommendations to see if this is an appropriate plan.  I discussed the case with Dr. Grandville Silos who has reviewed x-rays and EKGs.  Will plan for thumb spica and follow-up in his office.  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 Grandville Silos. Patient does have tenderness over this area with grind test.  Will air on the side of caution will continue with thumb spica and follow-up for further evaluation.  I appreciate Dr. Grandville Silos for discussing the case with me, providing further recommendations and agreeing to see the patient for follow-up.  Patient is not requesting anything for pain and she has hydrocodone on at home.  Return precautions were discussed.  Final Clinical Impressions(s) / ED Diagnoses   Final diagnoses:  Left hand pain    ED Discharge Orders    None       Lorelle Gibbs 10/27/18 1536    Fredia Sorrow, MD 10/28/18 [phone]

## 2018-10-27 NOTE — ED Triage Notes (Signed)
Left hand pain, fell 10/03/18

## 2018-10-27 NOTE — Discharge Instructions (Signed)
Your xrays were negative (no signs of fractures/broken bones or dislocations)  There is concern clinically for a scaphoid fracture. The scaphoid is one of the small bones in your wrist that is located on the thumb  side (one of the seven bones that make up the wrist). The scaphoid bone has a poor blood supply, so it can take a long time to heal. Fractures of the scaphoid do not always show up on xray's so we have placed you in a splint due to clinical concern. Please follow up with the hand specialist provided for further evaluation.

## 2020-07-31 DIAGNOSIS — M51369 Other intervertebral disc degeneration, lumbar region without mention of lumbar back pain or lower extremity pain: Secondary | ICD-10-CM | POA: Insufficient documentation

## 2021-10-22 ENCOUNTER — Emergency Department (HOSPITAL_COMMUNITY): Payer: Medicare Other

## 2021-10-22 ENCOUNTER — Emergency Department (HOSPITAL_COMMUNITY)
Admission: EM | Admit: 2021-10-22 | Discharge: 2021-10-22 | Disposition: A | Discharge status: Home / Self Care | Payer: Medicare Other | Attending: Emergency Medicine | Admitting: Emergency Medicine

## 2021-10-22 DIAGNOSIS — K219 Gastro-esophageal reflux disease without esophagitis: Secondary | ICD-10-CM | POA: Insufficient documentation

## 2021-10-22 DIAGNOSIS — Z96641 Presence of right artificial hip joint: Secondary | ICD-10-CM | POA: Insufficient documentation

## 2021-10-22 DIAGNOSIS — S4991XA Unspecified injury of right shoulder and upper arm, initial encounter: Secondary | ICD-10-CM | POA: Diagnosis present

## 2021-10-22 DIAGNOSIS — Y9301 Activity, walking, marching and hiking: Secondary | ICD-10-CM | POA: Insufficient documentation

## 2021-10-22 DIAGNOSIS — W01198A Fall on same level from slipping, tripping and stumbling with subsequent striking against other object, initial encounter: Secondary | ICD-10-CM | POA: Insufficient documentation

## 2021-10-22 DIAGNOSIS — S40011A Contusion of right shoulder, initial encounter: Secondary | ICD-10-CM | POA: Diagnosis not present

## 2021-10-22 DIAGNOSIS — S7002XA Contusion of left hip, initial encounter: Secondary | ICD-10-CM | POA: Diagnosis not present

## 2021-10-22 DIAGNOSIS — Z79899 Other long term (current) drug therapy: Secondary | ICD-10-CM | POA: Insufficient documentation

## 2021-10-22 DIAGNOSIS — W19XXXA Unspecified fall, initial encounter: Secondary | ICD-10-CM

## 2021-10-22 DIAGNOSIS — M19011 Primary osteoarthritis, right shoulder: Secondary | ICD-10-CM | POA: Insufficient documentation

## 2021-10-22 DIAGNOSIS — Z91040 Latex allergy status: Secondary | ICD-10-CM | POA: Insufficient documentation

## 2021-10-22 DIAGNOSIS — Y92002 Bathroom of unspecified non-institutional (private) residence single-family (private) house as the place of occurrence of the external cause: Secondary | ICD-10-CM | POA: Diagnosis not present

## 2021-10-22 DIAGNOSIS — I10 Essential (primary) hypertension: Secondary | ICD-10-CM | POA: Insufficient documentation

## 2021-10-22 NOTE — ED Triage Notes (Addendum)
Pt to ED via POV c/o fall, had mechanical ground level fall yesterday and ever since have pain in right shoulder and pelvic area progressively getting worse , pt able to bear weight and ambulate. Pt able to move fingers and right arm. Pt did not hit head when fell, pt not on blood thinners.

## 2021-10-22 NOTE — ED Provider Notes (Signed)
[facility] Provider Note   CSN: [id number] Arrival date & time: 10/22/21  1137     History Chief Complaint  Patient presents with   Fall   Shoulder Injury    Right     VLASTA BASKIN is a 65 y.o. female.   Fall Pertinent negatives include no chest pain, no abdominal pain, no headaches and no shortness of breath.  Shoulder Injury Pertinent negatives include no chest pain, no abdominal pain, no headaches and no shortness of breath. Patient presents after fall.  Last night had mechanical fall where she fell walking the bathroom.  Hit her right shoulder on the door frame and then landed on her left hip.  Has had pain in the shoulder and the hip since.  States the pain feels almost as if it is in the left groin.  Has been ambulatory.  Did not hit head.  Not on blood thinners.  No chest or abdominal pain.     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   CLOSED FRACTURE UNSPECIFIED PART NECK FEMUR 09/03/2010    Past Surgical History:  Procedure Laterality Date   ABDOMINAL HYSTERECTOMY     back stimulator     BACK SURGERY     laminectomy and  fusion X2 surgeries   CHOLECYSTECTOMY     COLONOSCOPY WITH PROPOFOL N/A 10/09/2018   Procedure: COLONOSCOPY WITH PROPOFOL;  Surgeon: Rogene Houston, MD;  [facility];  Service: Endoscopy;  Laterality: N/A;  1:05   HIP SURGERY     rt bipolar 10.26.2011   JOINT REPLACEMENT     NEPHRECTOMY Left    due to invasion of endometreosis   PLANTAR FASCIA RELEASE Right    POLYPECTOMY  10/09/2018   Procedure: POLYPECTOMY;  Surgeon: Rogene Houston, MD;  Location: [facility];  Service: Endoscopy;;  (colon)     OB History   No obstetric history on file.     No family history on file.  Social History   Tobacco Use   Smoking status: Never   Smokeless tobacco: Never  Vaping Use   Vaping Use: Never used  Substance Use Topics   Alcohol use: No   Drug use: No    Home Medications Prior to Admission medications   Medication Sig Start Date End Date Taking? Authorizing Provider  albuterol (PROVENTIL HFA;VENTOLIN HFA) 108 (90 Base) MCG/ACT inhaler Inhale 2 puffs into the lungs every 6 (six) hours as needed for wheezing or shortness of breath.    [provider]  ALPRAZolam Duanne Moron) 0.5 MG  tablet Take 0.5 mg by mouth 2 (two) times daily as needed for anxiety.     [provider]  amitriptyline (ELAVIL) 10 MG tablet Take 10 mg by mouth at bedtime.    [provider]  azelastine (ASTELIN) 0.1 % nasal spray Place 2 sprays into both nostrils daily as needed for rhinitis.     [provider]  buPROPion (WELLBUTRIN XL) 150 MG 24 hr tablet Take 150 mg by mouth 2 (two) times daily. 09/05/18   [provider]  Calcium Carb-Cholecalciferol (CALCIUM 600/VITAMIN D3 PO) Take 1 tablet by mouth 2 (two) times daily.    [provider]  cetirizine (ZYRTEC) 10 MG tablet Take 10 mg by mouth daily as needed for allergies.     [provider]  Cholecalciferol (VITAMIN D3) 50 MCG (2000 UT) TABS Take 2,000 Units by mouth daily.    [provider]  desvenlafaxine (PRISTIQ) 50 MG 24 hr tablet Take 50 mg by mouth daily.     [provider]  docusate sodium (COLACE) 100 MG capsule Take 100 mg by mouth daily.     [provider]  esomeprazole (NEXIUM) 40 MG capsule Take 40 mg by mouth daily.     [provider]  HYDROcodone-acetaminophen (NORCO/VICODIN) 5-325 MG tablet Take 1 tablet by mouth 2 (two) times daily as needed for moderate pain.     [provider]  hydrocortisone (ANUSOL-HC) 25 MG suppository Place 1 suppository (25 mg total) rectally at bedtime. 10/09/18   Rogene Houston, MD  naratriptan (AMERGE) 2.5 MG tablet Take 2.5 mg by mouth daily as needed for migraine.  08/25/18   [provider]  Prenatal Vit-Fe Fumarate-FA (PRENATAL PO) Take 1 tablet by mouth daily.    [provider]  QUEtiapine (SEROQUEL) 50 MG tablet Take 50 mg by mouth at bedtime.    [provider]  topiramate (TOPAMAX) 50 MG tablet Take 50 mg by mouth 2 (two) times daily.     [provider]  verapamil (CALAN-SR) 240 MG CR tablet Take 240 mg by mouth daily. 08/25/18   [provider]  Wheat Dextrin (BENEFIBER DRINK MIX) PACK Take 4 g by mouth at bedtime. 10/09/18   Rogene Houston, MD    Allergies    Latex, Oxycodone hcl, Sulfa drugs cross reactors, and Doxycycline  Review of Systems   Review of Systems  Constitutional:  Negative for appetite change.  HENT:  Negative for dental problem.   Respiratory:  Negative for shortness of breath.   Cardiovascular:  Negative for chest pain.  Gastrointestinal:  Negative for abdominal pain.  Genitourinary:  Negative for flank pain.  Musculoskeletal:  Negative for back pain.       Right shoulder and left hip pain.  Neurological:  Negative for weakness and headaches.  Psychiatric/Behavioral:  Negative for confusion.    Physical Exam Updated Vital Signs BP (!) 153/81    Pulse 76    Temp 98.2 F (36.8 C)    Resp 16    Ht  5\' 7"  (1.702 m)    Wt 81.6 kg    SpO2 94%    BMI 28.19 kg/m   Physical Exam Vitals and nursing note reviewed.  HENT:     Head: Atraumatic.  Eyes:     Pupils: Pupils are equal, round, and reactive to light.  Cardiovascular:     Rate and Rhythm: Regular rhythm.  Chest:     Chest wall: No tenderness.  Abdominal:  Tenderness: There is no abdominal tenderness.  Musculoskeletal:     Cervical back: Neck supple.     Comments: Mild tenderness to right shoulder.  Good range of motion.  No deformity.  Also some lateral tenderness on left hip.  Good range of motion.  Pelvis stable to lateral compression.  No pain with lateral compression.  No tenderness otherwise all extremities.  Skin:    General: Skin is warm.  Neurological:     Mental Status: She is alert and oriented to person, place, and time.    ED Results / Procedures / Treatments   Labs (all labs ordered are listed, but only abnormal results are displayed) Labs Reviewed - No data to display  EKG None  Radiology DG Shoulder Right  Result Date: 10/22/2021 CLINICAL DATA:  Right shoulder pain after fall EXAM: RIGHT SHOULDER - 2+ VIEW COMPARISON:  None. FINDINGS: There is no evidence of fracture or dislocation. Mild-moderate arthropathy of the [facility] joint. Glenohumeral joint space appears preserved. Soft tissues are unremarkable. IMPRESSION: No acute fracture or dislocation. Mild-moderate arthropathy of the right AC joint. Electronically Signed   By: Davina Poke D.O.   On: 10/22/2021 14:34   CT Hip Left Wo Contrast  Result Date: 10/22/2021 CLINICAL DATA:  Hip trauma with fracture suspected. Fell yesterday. Possible fracture on plain radiographs. EXAM: CT OF THE LEFT HIP WITHOUT CONTRAST TECHNIQUE: Multidetector CT imaging of the left hip was performed according to the standard protocol. Multiplanar CT image reconstructions were also generated. COMPARISON:  Left hip radiographs 10/22/2021 FINDINGS: Bones/Joint/Cartilage Mild  degenerative changes in the left hip. No evidence of acute fracture or dislocation. No focal bone lesion or bone destruction. Bone cortex appears intact. Linear changes seen on the previous study likely corresponded to degenerative change. Ligaments Suboptimally assessed by CT. Muscles and Tendons Normal appearance of visualized musculature. No intramuscular hematoma or collection. Soft tissues Surgical clips and vascular calcifications are seen in the left pelvis. No acute abnormalities are identified. IMPRESSION: Mild degenerative changes in the left hip. No acute displaced fractures identified. Electronically Signed   By: Lucienne Capers M.D.   On: 10/22/2021 19:51   DG Hip Unilat W or Wo Pelvis 2-3 Views Left  Result Date: 10/22/2021 CLINICAL DATA:  Left hip pain after fall EXAM: DG HIP (WITH OR WITHOUT PELVIS) 2-3V LEFT COMPARISON:  09/03/2010 FINDINGS: Hardware in the lumbar spine. Left lower quadrant stimulator generator. Clips in the left pelvis. Right hip replacement with normal alignment. Pubic symphysis and rami appear intact. Possible subtle linear lucency at the base of the femoral neck. IMPRESSION: 1. Possible faint linear lucency/subtle nondisplaced basicervical fracture on the left. Suggest CT for further evaluation. Electronically Signed   By: Donavan Foil M.D.   On: 10/22/2021 18:54    Procedures Procedures   Medications Ordered in ED Medications - No data to display  ED Course  I have reviewed the triage vital signs and the nursing notes.  Pertinent labs & imaging results that were available during my care of the patient were reviewed by me and considered in my medical decision making (see chart for details).    MDM Rules/Calculators/A&P                         Patient with fall.  Right shoulder and left hip/pelvic pain.  X-ray of shoulder only showed some arthritis prickly at the [facility] joint.  Left hip x-ray showed possible subcapital fracture.  CT scan done and  did not show  the fracture.  Patient has been ambulatory.  Potential occult fracture but felt less likely.  We will have patient follow-up with PCP as needed.  Will discharge home    Final Clinical Impression(s) / ED Diagnoses Final diagnoses:  Fall, initial encounter  Contusion of right shoulder, initial encounter  Contusion of left hip, initial encounter    Rx / DC Orders ED Discharge Orders     None        Davonna Belling, MD 10/22/21 2048

## 2021-10-22 NOTE — Discharge Instructions (Addendum)
Followup with your doctor as needed

## 2021-10-22 NOTE — ED Provider Notes (Signed)
Emergency Medicine Provider Triage Evaluation Note  Victoria Bailey , a 66 y.o. female  was evaluated in triage.  Pt complains of mechanical fall yesterday while walking to the bathroom.  Patient reports she tripped and fell.  Patient reports she fell onto her left hip and also hit her right shoulder on the way down.  She reports pain over her left hip, and right shoulder.  Denies other injuries or complaints.  She does have history of right hip replacement.  Review of Systems  Positive: As above Negative: As above  Physical Exam  BP 136/81 (BP Location: Left Arm)    Pulse 82    Temp 97.6 F (36.4 C)    Resp 14    Ht 5\' 7"  (1.702 m)    Wt 81.6 kg    SpO2 98%    BMI 28.19 kg/m  Gen:   Awake, no distress   Resp:  Normal effort  MSK:   Moves extremities without difficulty  Other:    Medical Decision Making  Medically screening exam initiated at 6:32 PM.  Appropriate orders placed.  Victoria Bailey was informed that the remainder of the evaluation will be completed by another provider, this initial triage assessment does not replace that evaluation, and the importance of remaining in the ED until their evaluation is complete.     Evlyn Courier, PA-C 10/22/21 1833    Davonna Belling, MD 10/23/21 0100

## 2022-07-10 ENCOUNTER — Encounter (INDEPENDENT_AMBULATORY_CARE_PROVIDER_SITE_OTHER): Payer: Self-pay | Admitting: *Deleted

## 2022-09-03 ENCOUNTER — Ambulatory Visit (INDEPENDENT_AMBULATORY_CARE_PROVIDER_SITE_OTHER): Payer: Medicare Other | Admitting: Gastroenterology

## 2022-09-13 ENCOUNTER — Other Ambulatory Visit: Payer: Self-pay | Admitting: Internal Medicine

## 2022-09-13 DIAGNOSIS — Z1231 Encounter for screening mammogram for malignant neoplasm of breast: Secondary | ICD-10-CM

## 2022-10-09 DIAGNOSIS — G43709 Chronic migraine without aura, not intractable, without status migrainosus: Secondary | ICD-10-CM | POA: Insufficient documentation

## 2022-11-25 ENCOUNTER — Encounter (INDEPENDENT_AMBULATORY_CARE_PROVIDER_SITE_OTHER): Payer: Self-pay | Admitting: Gastroenterology

## 2022-11-25 ENCOUNTER — Encounter (INDEPENDENT_AMBULATORY_CARE_PROVIDER_SITE_OTHER): Payer: Self-pay | Admitting: *Deleted

## 2022-11-25 ENCOUNTER — Ambulatory Visit (INDEPENDENT_AMBULATORY_CARE_PROVIDER_SITE_OTHER): Payer: Medicare Other | Admitting: Gastroenterology

## 2022-11-25 VITALS — BP 132/83 | HR 80 | Temp 98.0°F | Ht 67.0 in | Wt 187.1 lb

## 2022-11-25 DIAGNOSIS — R195 Other fecal abnormalities: Secondary | ICD-10-CM

## 2022-11-25 DIAGNOSIS — R152 Fecal urgency: Secondary | ICD-10-CM | POA: Diagnosis not present

## 2022-11-25 DIAGNOSIS — R197 Diarrhea, unspecified: Secondary | ICD-10-CM | POA: Diagnosis not present

## 2022-11-25 MED ORDER — PEG 3350-KCL-NA BICARB-NACL 420 G PO SOLR: 4000.0000 mL | Freq: Once | ORAL | 0 refills | Status: AC

## 2022-11-25 NOTE — H&P (View-Only) (Signed)
Referring Provider: Kennieth Rad, MD Primary Care Physician:  Kennieth Rad, MD Primary GI Physician: new   Chief Complaint  Patient presents with   positive cologuard    Referred for positive cologuard test.    HPI:   Victoria Bailey is a 67 y.o. female with past medical history of anxiety, depression, HTN, GERD, neuropathy, osteoporosis, sleep apnea   Patient presenting today as a new patient for positive cologuard  Last labs feb 2023 with hgb 13.6 Positive cologuard March 2023  Patient denies rectal bleeding, melena. For the past year she has had some issues a few times per month with feeling that "foods go right through her" sometimes after eating. She will have to go to the restroom to have diarrhea. Typically just one episode. She will have some abdominal discomfort which improves after defecation. She also notes a lot of gas after eating since her GB removal many years ago. No appetite changes or weight loss, no nausea or vomiting. Other than episodes of diarrhea, she typically has a BM every 2-3 days.    Patient is maintained on vicodin BID for chronic pain. Has some occasional constipation at times but does not feel this is a real issue for her. Does not take anything for this   NSAID [id number]  Social hx:no etoh or tobacco   Fam hx: grandmother had colon cancer    Last Colonoscopy: 09/2018 - One 5 mm polyp in the proximal sigmoid colon-Tubular adenoma  - External and internal hemorrhoids. 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    Past Surgical History:  Procedure  Laterality Date   ABDOMINAL HYSTERECTOMY     back stimulator     BACK SURGERY     laminectomy and fusion X2 surgeries   CHOLECYSTECTOMY     COLONOSCOPY WITH PROPOFOL N/A 10/09/2018   Procedure: COLONOSCOPY WITH PROPOFOL;  Surgeon: Rogene Houston, MD;  [facility]: [facility];  Service: Endoscopy;  Laterality: N/A;  1:05   HIP SURGERY     rt bipolar 10.26.2011   JOINT REPLACEMENT     NEPHRECTOMY Left    due to invasion of endometreosis   PLANTAR FASCIA RELEASE Right    POLYPECTOMY  10/09/2018   Procedure: POLYPECTOMY;  Surgeon: Rogene Houston, MD;  [facility]: [facility];  Service: Endoscopy;;  (colon)    Current Outpatient Medications  Medication Sig Dispense Refill   amitriptyline (ELAVIL) 10 MG tablet Take 10 mg by mouth at bedtime.     ARIPiprazole (ABILIFY) 5 MG tablet Take 5 mg by mouth daily.     azelastine (ASTELIN) 0.1 % nasal spray Place 2 sprays into both nostrils daily as needed for rhinitis.      buPROPion (WELLBUTRIN XL) 150 MG 24 hr tablet Take 150 mg by mouth. One tid  1   calcitonin, salmon, (MIACALCIN/FORTICAL) 200 UNIT/ACT nasal spray Place 1 spray into alternate nostrils daily.     Calcium Carb-Cholecalciferol (CALCIUM 600/VITAMIN D3 PO) Take 1 tablet by  mouth 2 (two) times daily.     cetirizine (ZYRTEC) 10 MG tablet Take 10 mg by mouth daily as needed for allergies.      Cholecalciferol (VITAMIN D3 PO) Take 5,000 Units by mouth daily.     esomeprazole (NEXIUM) 40 MG capsule Take 40 mg by mouth daily.      HYDROcodone-acetaminophen (NORCO/VICODIN) 5-325 MG tablet Take 1 tablet by mouth 2 (two) times daily as needed for moderate pain.      levothyroxine (SYNTHROID) 25 MCG tablet Take by mouth.     naratriptan (AMERGE) 2.5 MG tablet Take 2.5 mg by mouth daily as needed for migraine.   11   topiramate (TOPAMAX) 100 MG tablet Take 100 mg by mouth 2 (two) times daily.     verapamil (CALAN-SR) 240 MG CR tablet Take 240 mg by mouth daily.  3   Current  Facility-Administered Medications  Medication Dose Route Frequency Provider Last Rate Last Admin   methylPREDNISolone acetate (DEPO-MEDROL) injection 40 mg  40 mg Intra-articular Once Carole Civil, MD       methylPREDNISolone acetate (DEPO-MEDROL) injection 40 mg  40 mg Intra-articular Once Carole Civil, MD       methylPREDNISolone acetate (DEPO-MEDROL) injection 40 mg  40 mg Intra-articular Once Carole Civil, MD        Allergies as of 11/25/2022 - Review Complete 11/25/2022  Allergen Reaction Noted   Latex Other (See Comments) 03/29/2013   Oxycodone hcl Nausea And Vomiting 02/27/2011   Oxycontin [oxycodone]  11/25/2022   Sulfa drugs cross reactors Nausea And Vomiting 02/27/2011   Doxycycline Hives and Rash 02/27/2011    No family history on file.  Social History   Socioeconomic History   Marital status: Divorced    Spouse name: Not on file   Number of children: Not on file   Years of education: Not on file   Highest education level: Not on file  Occupational History   Not on file  Tobacco Use   Smoking status: Never    Passive exposure: Current   Smokeless tobacco: Never  Vaping Use   Vaping Use: Never used  Substance and Sexual Activity   Alcohol use: No   Drug use: No   Sexual activity: Yes    Birth control/protection: Surgical  Other Topics Concern   Not on file  Social History Narrative   Not on file   Social Determinants of Health   Financial Resource Strain: Not on file  Food Insecurity: Not on file  Transportation Needs: Not on file  Physical Activity: Not on file  Stress: Not on file  Social Connections: Not on file   Review of systems General: negative for malaise, night sweats, fever, chills, weight loss Neck: Negative for lumps, goiter, pain and significant neck swelling Resp: Negative for cough, wheezing, dyspnea at rest CV: Negative for chest pain, leg swelling, palpitations, orthopnea GI: denies melena, hematochezia,  nausea, vomiting, constipation, dysphagia, odyonophagia, early satiety or unintentional weight loss. +diarrhea  MSK: Negative for joint pain or swelling, back pain, and muscle pain. Derm: Negative for itching or rash Psych: Denies depression, anxiety, memory loss, confusion. No homicidal or suicidal ideation.  Heme: Negative for prolonged bleeding, bruising easily, and swollen nodes. Endocrine: Negative for cold or heat intolerance, polyuria, polydipsia and goiter. Neuro: negative for tremor, gait imbalance, syncope and seizures. The remainder of the review of systems is noncontributory.  Physical Exam: There were no vitals taken for this visit. General:   Alert and  oriented. No distress noted. Pleasant and cooperative.  Head:  Normocephalic and atraumatic. Eyes:  Conjuctiva clear without scleral icterus. Mouth:  Oral mucosa pink and moist. Good dentition. No lesions. Heart: Normal rate and rhythm, s1 and s2 heart sounds present.  Lungs: Clear lung sounds in all lobes. Respirations equal and unlabored. Abdomen:  +BS, soft, non-tender and non-distended. No rebound or guarding. No HSM or masses noted. Derm: No palmar erythema or jaundice Msk:  Symmetrical without gross deformities. Normal posture. Extremities:  Without edema. Neurologic:  Alert and  oriented x4 Psych:  Alert and cooperative. Normal mood and affect.  Invalid input(s): "6 MONTHS"   ASSESSMENT: Victoria Bailey is a 67 y.o. female presenting today as a new patient for positive cologuard.   Positive cologuard: March 2023, history of TAs noted on last TCS in 2019. No rectal bleeding or melena. Last labs in feb 2023 without anemia. Discussed with the patient that Cologuard is intended for the qualitative detection of colorectal neoplasia associated DNA markers and for the presence of occult hemoglobin in human stool. A positive result may indicate the presence of colorectal cancer (CRC) or pre cancerous polyps, and should be  followed by colonoscopy. False positives and false negatives do occur. Indications, risks and benefits of procedure discussed in detail with patient. Patient verbalized understanding and is in agreement to proceed with colonoscopy at this time. Would recommend against further cologuard testing given patient's history of colon polyps  Fecal urgency/diarrhea: occurring a few times per month, usually with one episode of diarrhea, abdominal discomfort that improves after defecations. No alarm symptoms associated, could be some aspect of IBS/BAD given previous cholecystectomy. Recommend keeping a food/stool journal x2 weeks to evaluate for specific food triggers. She will make me aware if symptoms worsen or she develops new associated symptoms    PLAN:  Schedule Colonoscopy-ASA II ENDO 1 (2 day prep due to use of opiates) 2. Food/stool journal 3. Pt to make me aware of new or worsening GI symptoms   Follow Up: TBD after procedure  Kaydra Borgen L. Alver Sorrow, MSN, APRN, AGNP-C Adult-Gerontology Nurse Practitioner [facility]  I have reviewed the note and agree with the APP's assessment as described in this progress note  Maylon Peppers, MD Gastroenterology and Hepatology [facility]

## 2022-11-25 NOTE — Patient Instructions (Signed)
As discussed, Cologuard is intended for the qualitative detection of colorectal neoplasia associated DNA markers and for the presence of occult hemoglobin in human stool. A positive result may indicate the presence of colorectal cancer (CRC) or pre cancerous polyps, and should be followed by colonoscopy. A negative Cologuard test result does not guarantee absence of cancer or pre cancerous polyp.False positives and false negatives do occur.  I would recommend against further cologuard testing given your history of polyps, we will schedule you for colonoscopy. In regards to fecal urgency/occasional diarrhea, I would keep a food/stool journal for the next 2 weeks to determine if there are any specific food triggers. Please let me know if symptoms become worse or you develop other associated symptoms  Follow up will be determined after colonoscopy

## 2022-11-25 NOTE — Addendum Note (Signed)
Addended by: Cheron Every on: 11/25/2022 09:59 AM   Modules accepted: Orders

## 2022-11-25 NOTE — Progress Notes (Signed)
Referring Provider: Kennieth Rad, MD Primary Care Physician:  Kennieth Rad, MD Primary GI Physician: new   Chief Complaint  Patient presents with   positive cologuard    Referred for positive cologuard test.    HPI:   Victoria Bailey is a 67 y.o. female with past medical history of anxiety, depression, HTN, GERD, neuropathy, osteoporosis, sleep apnea   Patient presenting today as a new patient for positive cologuard  Last labs feb 2023 with hgb 13.6 Positive cologuard March 2023  Patient denies rectal bleeding, melena. For the past year she has had some issues a few times per month with feeling that "foods go right through her" sometimes after eating. She will have to go to the restroom to have diarrhea. Typically just one episode. She will have some abdominal discomfort which improves after defecation. She also notes a lot of gas after eating since her GB removal many years ago. No appetite changes or weight loss, no nausea or vomiting. Other than episodes of diarrhea, she typically has a BM every 2-3 days.    Patient is maintained on vicodin BID for chronic pain. Has some occasional constipation at times but does not feel this is a real issue for her. Does not take anything for this   NSAID [id number]  Social hx:no etoh or tobacco   Fam hx: grandmother had colon cancer    Last Colonoscopy: 09/2018 - One 5 mm polyp in the proximal sigmoid colon-Tubular adenoma  - External and internal hemorrhoids. 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    Past Surgical History:  Procedure  Laterality Date   ABDOMINAL HYSTERECTOMY     back stimulator     BACK SURGERY     laminectomy and fusion X2 surgeries   CHOLECYSTECTOMY     COLONOSCOPY WITH PROPOFOL N/A 10/09/2018   Procedure: COLONOSCOPY WITH PROPOFOL;  Surgeon: Rogene Houston, MD;  Location: [facility];  Service: Endoscopy;  Laterality: N/A;  1:05   HIP SURGERY     rt bipolar 10.26.2011   JOINT REPLACEMENT     NEPHRECTOMY Left    due to invasion of endometreosis   PLANTAR FASCIA RELEASE Right    POLYPECTOMY  10/09/2018   Procedure: POLYPECTOMY;  Surgeon: Rogene Houston, MD;  Location: [facility];  Service: Endoscopy;;  (colon)    Current Outpatient Medications  Medication Sig Dispense Refill   amitriptyline (ELAVIL) 10 MG tablet Take 10 mg by mouth at bedtime.     ARIPiprazole (ABILIFY) 5 MG tablet Take 5 mg by mouth daily.     azelastine (ASTELIN) 0.1 % nasal spray Place 2 sprays into both nostrils daily as needed for rhinitis.      buPROPion (WELLBUTRIN XL) 150 MG 24 hr tablet Take 150 mg by mouth. One tid  1   calcitonin, salmon, (MIACALCIN/FORTICAL) 200 UNIT/ACT nasal spray Place 1 spray into alternate nostrils daily.     Calcium Carb-Cholecalciferol (CALCIUM 600/VITAMIN D3 PO) Take 1 tablet by  mouth 2 (two) times daily.     cetirizine (ZYRTEC) 10 MG tablet Take 10 mg by mouth daily as needed for allergies.      Cholecalciferol (VITAMIN D3 PO) Take 5,000 Units by mouth daily.     esomeprazole (NEXIUM) 40 MG capsule Take 40 mg by mouth daily.      HYDROcodone-acetaminophen (NORCO/VICODIN) 5-325 MG tablet Take 1 tablet by mouth 2 (two) times daily as needed for moderate pain.      levothyroxine (SYNTHROID) 25 MCG tablet Take by mouth.     naratriptan (AMERGE) 2.5 MG tablet Take 2.5 mg by mouth daily as needed for migraine.   11   topiramate (TOPAMAX) 100 MG tablet Take 100 mg by mouth 2 (two) times daily.     verapamil (CALAN-SR) 240 MG CR tablet Take 240 mg by mouth daily.  3   Current  Facility-Administered Medications  Medication Dose Route Frequency Provider Last Rate Last Admin   methylPREDNISolone acetate (DEPO-MEDROL) injection 40 mg  40 mg Intra-articular Once Carole Civil, MD       methylPREDNISolone acetate (DEPO-MEDROL) injection 40 mg  40 mg Intra-articular Once Carole Civil, MD       methylPREDNISolone acetate (DEPO-MEDROL) injection 40 mg  40 mg Intra-articular Once Carole Civil, MD        Allergies as of 11/25/2022 - Review Complete 11/25/2022  Allergen Reaction Noted   Latex Other (See Comments) 03/29/2013   Oxycodone hcl Nausea And Vomiting 02/27/2011   Oxycontin [oxycodone]  11/25/2022   Sulfa drugs cross reactors Nausea And Vomiting 02/27/2011   Doxycycline Hives and Rash 02/27/2011    No family history on file.  Social History   Socioeconomic History   Marital status: Divorced    Spouse name: Not on file   Number of children: Not on file   Years of education: Not on file   Highest education level: Not on file  Occupational History   Not on file  Tobacco Use   Smoking status: Never    Passive exposure: Current   Smokeless tobacco: Never  Vaping Use   Vaping Use: Never used  Substance and Sexual Activity   Alcohol use: No   Drug use: No   Sexual activity: Yes    Birth control/protection: Surgical  Other Topics Concern   Not on file  Social History Narrative   Not on file   Social Determinants of Health   Financial Resource Strain: Not on file  Food Insecurity: Not on file  Transportation Needs: Not on file  Physical Activity: Not on file  Stress: Not on file  Social Connections: Not on file   Review of systems General: negative for malaise, night sweats, fever, chills, weight loss Neck: Negative for lumps, goiter, pain and significant neck swelling Resp: Negative for cough, wheezing, dyspnea at rest CV: Negative for chest pain, leg swelling, palpitations, orthopnea GI: denies melena, hematochezia,  nausea, vomiting, constipation, dysphagia, odyonophagia, early satiety or unintentional weight loss. +diarrhea  MSK: Negative for joint pain or swelling, back pain, and muscle pain. Derm: Negative for itching or rash Psych: Denies depression, anxiety, memory loss, confusion. No homicidal or suicidal ideation.  Heme: Negative for prolonged bleeding, bruising easily, and swollen nodes. Endocrine: Negative for cold or heat intolerance, polyuria, polydipsia and goiter. Neuro: negative for tremor, gait imbalance, syncope and seizures. The remainder of the review of systems is noncontributory.  Physical Exam: There were no vitals taken for this visit. General:   Alert and  oriented. No distress noted. Pleasant and cooperative.  Head:  Normocephalic and atraumatic. Eyes:  Conjuctiva clear without scleral icterus. Mouth:  Oral mucosa pink and moist. Good dentition. No lesions. Heart: Normal rate and rhythm, s1 and s2 heart sounds present.  Lungs: Clear lung sounds in all lobes. Respirations equal and unlabored. Abdomen:  +BS, soft, non-tender and non-distended. No rebound or guarding. No HSM or masses noted. Derm: No palmar erythema or jaundice Msk:  Symmetrical without gross deformities. Normal posture. Extremities:  Without edema. Neurologic:  Alert and  oriented x4 Psych:  Alert and cooperative. Normal mood and affect.  Invalid input(s): "6 MONTHS"   ASSESSMENT: MARGURIETE NEGASH is a 67 y.o. female presenting today as a new patient for positive cologuard.   Positive cologuard: March 2023, history of TAs noted on last TCS in 2019. No rectal bleeding or melena. Last labs in feb 2023 without anemia. Discussed with the patient that Cologuard is intended for the qualitative detection of colorectal neoplasia associated DNA markers and for the presence of occult hemoglobin in human stool. A positive result may indicate the presence of colorectal cancer (CRC) or pre cancerous polyps, and should be  followed by colonoscopy. False positives and false negatives do occur. Indications, risks and benefits of procedure discussed in detail with patient. Patient verbalized understanding and is in agreement to proceed with colonoscopy at this time. Would recommend against further cologuard testing given patient's history of colon polyps  Fecal urgency/diarrhea: occurring a few times per month, usually with one episode of diarrhea, abdominal discomfort that improves after defecations. No alarm symptoms associated, could be some aspect of IBS/BAD given previous cholecystectomy. Recommend keeping a food/stool journal x2 weeks to evaluate for specific food triggers. She will make me aware if symptoms worsen or she develops new associated symptoms    PLAN:  Schedule Colonoscopy-ASA II ENDO 1 (2 day prep due to use of opiates) 2. Food/stool journal 3. Pt to make me aware of new or worsening GI symptoms   Follow Up: TBD after procedure  Donn Wilmot L. Alver Sorrow, MSN, APRN, AGNP-C Adult-Gerontology Nurse Practitioner [facility]  I have reviewed the note and agree with the APP's assessment as described in this progress note  Maylon Peppers, MD Gastroenterology and Hepatology [facility]

## 2022-12-12 ENCOUNTER — Encounter (HOSPITAL_COMMUNITY)
Admission: RE | Disposition: A | Discharge status: Home / Self Care | Payer: Self-pay | Source: Home / Self Care | Attending: Gastroenterology

## 2022-12-12 ENCOUNTER — Ambulatory Visit (HOSPITAL_COMMUNITY): Payer: Medicare Other | Admitting: Certified Registered Nurse Anesthetist

## 2022-12-12 ENCOUNTER — Other Ambulatory Visit: Payer: Self-pay

## 2022-12-12 ENCOUNTER — Ambulatory Visit (HOSPITAL_COMMUNITY)
Admission: RE | Admit: 2022-12-12 | Discharge: 2022-12-12 | Disposition: A | Discharge status: Home / Self Care | Payer: Medicare Other | Attending: Gastroenterology | Admitting: Gastroenterology

## 2022-12-12 ENCOUNTER — Encounter (HOSPITAL_COMMUNITY): Payer: Self-pay | Admitting: Gastroenterology

## 2022-12-12 ENCOUNTER — Ambulatory Visit (HOSPITAL_BASED_OUTPATIENT_CLINIC_OR_DEPARTMENT_OTHER): Payer: Medicare Other | Admitting: Certified Registered Nurse Anesthetist

## 2022-12-12 DIAGNOSIS — K644 Residual hemorrhoidal skin tags: Secondary | ICD-10-CM | POA: Insufficient documentation

## 2022-12-12 DIAGNOSIS — K635 Polyp of colon: Secondary | ICD-10-CM

## 2022-12-12 DIAGNOSIS — D123 Benign neoplasm of transverse colon: Secondary | ICD-10-CM | POA: Diagnosis not present

## 2022-12-12 DIAGNOSIS — K580 Irritable bowel syndrome with diarrhea: Secondary | ICD-10-CM | POA: Insufficient documentation

## 2022-12-12 DIAGNOSIS — F32A Depression, unspecified: Secondary | ICD-10-CM | POA: Insufficient documentation

## 2022-12-12 DIAGNOSIS — M810 Age-related osteoporosis without current pathological fracture: Secondary | ICD-10-CM | POA: Insufficient documentation

## 2022-12-12 DIAGNOSIS — G4730 Sleep apnea, unspecified: Secondary | ICD-10-CM | POA: Insufficient documentation

## 2022-12-12 DIAGNOSIS — D125 Benign neoplasm of sigmoid colon: Secondary | ICD-10-CM | POA: Diagnosis not present

## 2022-12-12 DIAGNOSIS — R195 Other fecal abnormalities: Secondary | ICD-10-CM | POA: Diagnosis present

## 2022-12-12 DIAGNOSIS — D124 Benign neoplasm of descending colon: Secondary | ICD-10-CM | POA: Insufficient documentation

## 2022-12-12 DIAGNOSIS — K649 Unspecified hemorrhoids: Secondary | ICD-10-CM

## 2022-12-12 DIAGNOSIS — G8929 Other chronic pain: Secondary | ICD-10-CM | POA: Insufficient documentation

## 2022-12-12 DIAGNOSIS — F419 Anxiety disorder, unspecified: Secondary | ICD-10-CM | POA: Insufficient documentation

## 2022-12-12 DIAGNOSIS — K219 Gastro-esophageal reflux disease without esophagitis: Secondary | ICD-10-CM | POA: Insufficient documentation

## 2022-12-12 DIAGNOSIS — K648 Other hemorrhoids: Secondary | ICD-10-CM | POA: Diagnosis not present

## 2022-12-12 DIAGNOSIS — Z800 Family history of malignant neoplasm of digestive organs: Secondary | ICD-10-CM | POA: Insufficient documentation

## 2022-12-12 DIAGNOSIS — I10 Essential (primary) hypertension: Secondary | ICD-10-CM | POA: Diagnosis not present

## 2022-12-12 DIAGNOSIS — G629 Polyneuropathy, unspecified: Secondary | ICD-10-CM | POA: Insufficient documentation

## 2022-12-12 HISTORY — PX: POLYPECTOMY: SHX5525

## 2022-12-12 HISTORY — PX: COLONOSCOPY WITH PROPOFOL: SHX5780

## 2022-12-12 HISTORY — PX: SCLEROTHERAPY: SHX6841

## 2022-12-12 HISTORY — DX: Hypothyroidism, unspecified: E03.9

## 2022-12-12 HISTORY — PX: BIOPSY: SHX5522

## 2022-12-12 HISTORY — PX: SUBMUCOSAL TATTOO INJECTION: SHX6856

## 2022-12-12 LAB — HM COLONOSCOPY

## 2022-12-12 SURGERY — COLONOSCOPY WITH PROPOFOL
Anesthesia: General

## 2022-12-12 MED ORDER — PROPOFOL 10 MG/ML IV BOLUS
INTRAVENOUS | Status: DC | PRN
  Administered 2022-12-12: 100 mg via INTRAVENOUS

## 2022-12-12 MED ORDER — PROPOFOL 500 MG/50ML IV EMUL
INTRAVENOUS | Status: DC | PRN
  Administered 2022-12-12: 150 ug/kg/min via INTRAVENOUS

## 2022-12-12 MED ORDER — LACTATED RINGERS IV SOLN: INTRAVENOUS | Status: DC

## 2022-12-12 MED ORDER — SPOT INK MARKER SYRINGE KIT
PACK | SUBMUCOSAL | Status: AC
  Filled 2022-12-12: qty 5

## 2022-12-12 MED ORDER — PROPOFOL 500 MG/50ML IV EMUL
INTRAVENOUS | Status: AC
  Filled 2022-12-12: qty 50

## 2022-12-12 MED ORDER — LIDOCAINE HCL (CARDIAC) PF 100 MG/5ML IV SOSY
PREFILLED_SYRINGE | INTRAVENOUS | Status: DC | PRN
  Administered 2022-12-12: 50 mg via INTRAVENOUS

## 2022-12-12 MED ORDER — LACTATED RINGERS IV SOLN: INTRAVENOUS | Status: DC | PRN

## 2022-12-12 MED ORDER — SODIUM CHLORIDE FLUSH 0.9 % IV SOLN
INTRAVENOUS | Status: AC
  Filled 2022-12-12: qty 10

## 2022-12-12 NOTE — Anesthesia Preprocedure Evaluation (Signed)
Anesthesia Evaluation  Patient identified by MRN, date of birth, ID band Patient awake    Reviewed: Allergy & Precautions, H&P , NPO status , Patient's Chart, lab work & pertinent test results, reviewed documented beta blocker date and time   Airway Mallampati: II  TM Distance: >3 FB Neck ROM: full    Dental no notable dental hx.    Pulmonary neg pulmonary ROS, sleep apnea    Pulmonary exam normal breath sounds clear to auscultation       Cardiovascular Exercise Tolerance: Good hypertension, negative cardio ROS  Rhythm:regular Rate:Normal     Neuro/Psych  Headaches PSYCHIATRIC DISORDERS Anxiety Depression     Neuromuscular disease negative neurological ROS  negative psych ROS   GI/Hepatic negative GI ROS, Neg liver ROS,GERD  ,,  Endo/Other  negative endocrine ROSHypothyroidism    Renal/GU negative Renal ROS  negative genitourinary   Musculoskeletal   Abdominal   Peds  Hematology negative hematology ROS (+)   Anesthesia Other Findings   Reproductive/Obstetrics negative OB ROS                             Anesthesia Physical Anesthesia Plan  ASA: 2  Anesthesia Plan: General   Post-op Pain Management:    Induction:   PONV Risk Score and Plan: Propofol infusion  Airway Management Planned:   Additional Equipment:   Intra-op Plan:   Post-operative Plan:   Informed Consent: I have reviewed the patients History and Physical, chart, labs and discussed the procedure including the risks, benefits and alternatives for the proposed anesthesia with the patient or authorized representative who has indicated his/her understanding and acceptance.     Dental Advisory Given  Plan Discussed with: CRNA  Anesthesia Plan Comments:        Anesthesia Quick Evaluation

## 2022-12-12 NOTE — Op Note (Signed)
[facility] Patient Name: Victoria Bailey Procedure Date: 12/12/2022 9:40 AM MRN: [id number] Date of Birth: [birth year] Attending MD: Maylon Peppers , , [id number] CSN: [id number] Age: 67 Admit Type: Outpatient Procedure:                Colonoscopy Indications:              Positive Cologuard test Providers:                Maylon Peppers, Janeece Riggers, RN, Ladoris Gene                            Technician, Technician Referring MD:              Medicines:                Monitored Anesthesia Care Complications:            No immediate complications. Estimated Blood Loss:     Estimated blood loss: none. Procedure:                Pre-Anesthesia Assessment:                           - Prior to the procedure, a History and Physical                            was performed, and patient medications, allergies                            and sensitivities were reviewed. The patient's                            tolerance of previous anesthesia was reviewed.                           - The risks and benefits of the procedure and the                            sedation options and risks were discussed with the                            patient. All questions were answered and informed                            consent was obtained.                           - ASA Grade Assessment: II - A patient with mild                            systemic disease.                           After obtaining informed consent, the colonoscope                            was passed under direct vision. Throughout the  procedure, the patient's blood pressure, pulse, and                            oxygen saturations were monitored continuously. The                            [id number]-HQ190L ([id number]) scope was introduced through                            the anus and advanced to the the cecum, identified                            by appendiceal orifice and ileocecal valve. The                             colonoscopy was performed without difficulty. The                            patient tolerated the procedure well. The quality                            of the bowel preparation was excellent. Scope In: 9:57:13 AM Scope Out: 10:39:45 AM Scope Withdrawal Time: 0 hours 37 minutes 51 seconds  Total Procedure Duration: 0 hours 42 minutes 32 seconds  Findings:      The perianal and digital rectal examinations were normal.      Two sessile polyps were found in the transverse colon. The polyps were 2       to 3 mm in size. These polyps were removed with a cold snare. Resection       and retrieval were complete.      A 15 mm polyp was found in the descending colon. The polyp was flat.       Area was successfully injected with 10 mL Eleview for a lift       polypectomy. Imaging was performed using white light and narrow band       imaging to visualize the mucosa and demarcate the polyp site after       injection for EMR purposes. The polyp was removed with a piecemeal       technique using a hot snare. Resection and retrieval were complete. An       area contralateral and 2 cm distal to polypectomy site was tattooed with       an injection of 3 mL of Spot (carbon black).      Non-bleeding internal hemorrhoids were found during retroflexion. The       hemorrhoids were medium-sized. Impression:               - Two 2 to 3 mm polyps in the transverse colon,                            removed with a cold snare. Resected and retrieved.                           - One 15 mm polyp in the descending colon, removed  piecemeal using a hot snare. Resected and                            retrieved. Injected. Tattooed.                           - Non-bleeding internal hemorrhoids. Moderate Sedation:      Per Anesthesia Care Recommendation:           - Discharge patient to home (ambulatory).                           - Resume previous diet.                           -  Await pathology results.                           - Repeat colonoscopy in 1 year for surveillance                            after piecemeal polypectomy. Procedure Code(s):        --- Professional ---                           [phone], 59, Colonoscopy, flexible; with removal of                            tumor(s), polyp(s), or other lesion(s) by snare                            technique                           [id number], Colonoscopy, flexible; with directed                            submucosal injection(s), any substance Diagnosis Code(s):        --- Professional ---                           D12.3, Benign neoplasm of transverse colon (hepatic                            flexure or splenic flexure)                           D12.4, Benign neoplasm of descending colon                           K64.8, Other hemorrhoids                           R19.5, Other fecal abnormalities CPT copyright 2022 American Medical Association. All rights reserved. The codes documented in this report are preliminary and upon coder review may  be revised to meet current compliance requirements. Maylon Peppers, MD Maylon Peppers,  12/12/2022 10:51:40 AM This report has been signed electronically.  Number of Addenda: 0 

## 2022-12-12 NOTE — Transfer of Care (Signed)
Immediate Anesthesia Transfer of Care Note  Patient: Victoria Bailey  Procedure(s) Performed: COLONOSCOPY WITH PROPOFOL POLYPECTOMY BIOPSY SCLEROTHERAPY SUBMUCOSAL TATTOO INJECTION  Patient Location: Endoscopy Unit  Anesthesia Type:General  Level of Consciousness: awake  Airway & Oxygen Therapy: Patient Spontanous Breathing  Post-op Assessment: Report given to RN and Post -op Vital signs reviewed and stable  Post vital signs: Reviewed and stable  Last Vitals:  Vitals Value Taken Time  BP 88/55 12/12/22 1042  Temp 36.5 C 12/12/22 1042  Pulse 76 12/12/22 1043  Resp 10 12/12/22 1043  SpO2 98 % 12/12/22 1043  Vitals shown include unvalidated device data.  Last Pain:  Vitals:   12/12/22 1042  TempSrc: Axillary  PainSc:       Patients Stated Pain Goal: 5 ([id number])  Complications: No notable events documented.

## 2022-12-12 NOTE — Interval H&P Note (Signed)
History and Physical Interval Note:  12/12/2022 9:42 AM  Victoria Bailey  has presented today for surgery, with the diagnosis of positive cologuard.  The various methods of treatment have been discussed with the patient and family. After consideration of risks, benefits and other options for treatment, the patient has consented to  Procedure(s) with comments: COLONOSCOPY WITH PROPOFOL (N/A) - 10:45am, asa 2 as a surgical intervention.  The patient's history has been reviewed, patient examined, no change in status, stable for surgery.  I have reviewed the patient's chart and labs.  Questions were answered to the patient's satisfaction.     [name]

## 2022-12-12 NOTE — Discharge Instructions (Signed)
You are being discharged to home.  Resume your previous diet.  We are waiting for your pathology results.  Your physician has recommended a repeat colonoscopy in one year for surveillance after today's piecemeal polypectomy.

## 2022-12-13 ENCOUNTER — Encounter (INDEPENDENT_AMBULATORY_CARE_PROVIDER_SITE_OTHER): Payer: Self-pay | Admitting: *Deleted

## 2022-12-13 LAB — SURGICAL PATHOLOGY

## 2022-12-14 NOTE — Anesthesia Postprocedure Evaluation (Signed)
Anesthesia Post Note  Patient: Victoria Bailey  Procedure(s) Performed: COLONOSCOPY WITH PROPOFOL POLYPECTOMY BIOPSY SCLEROTHERAPY SUBMUCOSAL TATTOO INJECTION  Patient location during evaluation: Phase II Anesthesia Type: General Level of consciousness: awake Pain management: pain level controlled Vital Signs Assessment: post-procedure vital signs reviewed and stable Respiratory status: spontaneous breathing and respiratory function stable Cardiovascular status: blood pressure returned to baseline and stable Postop Assessment: no headache and no apparent nausea or vomiting Anesthetic complications: no Comments: Late entry   No notable events documented.   Last Vitals:  Vitals:   12/12/22 1049 12/12/22 1052  BP: 122/72 124/83  Pulse: 77 76  Resp: 13 13  Temp:    SpO2: 98% 98%    Last Pain:  Vitals:   12/12/22 1049  TempSrc:   PainSc: 0-No pain                 Louann Sjogren

## 2022-12-18 ENCOUNTER — Encounter (HOSPITAL_COMMUNITY): Payer: Self-pay | Admitting: Gastroenterology

## 2023-05-23 ENCOUNTER — Encounter (INDEPENDENT_AMBULATORY_CARE_PROVIDER_SITE_OTHER): Payer: Self-pay | Admitting: *Deleted

## 2023-06-20 ENCOUNTER — Telehealth (INDEPENDENT_AMBULATORY_CARE_PROVIDER_SITE_OTHER): Payer: Self-pay | Admitting: *Deleted

## 2023-06-20 NOTE — Telephone Encounter (Signed)
Room 1 , 2 day prep Thanks 

## 2023-06-20 NOTE — Telephone Encounter (Signed)
Referring MD/PCP: Celine Mans  Insurance: [employer]  Best Phone Number: [phone]  Reason for the colonoscopy hx polyps - 6 month recall  Has patient had this procedure before?  Yes, 11/2022  If so, when, by whom and where?    Is there a family history of colon cancer?  Yes, grandmother  Who?  What age when diagnosed?    Is patient diabetic? If yes, Type 1 or Type 2   no      Does patient have prosthetic heart valve or mechanical valve?  no  Do you have a pacemaker/defibrillator?  no  Has patient ever had endocarditis/atrial fibrillation? no  Has patient had joint replacement within last 12 months?  no  Is patient constipated or do they take laxatives? yes  Does patient have a history of alcohol/drug use?  no  Does patient use oxygen? no  Have you had a stroke/heart attack last 6 mths? no  Do you take medicine for weight loss?  no  For female patients,: have you had a hysterectomy yes                      are you post menopausal no                      do you still have your menstrual cycle no  Do you take any blood-thinning medications such as: (aspirin, warfarin, Plavix, Aggrenox)  no  If yes we need the name, milligram, dosage and who is prescribing doctor   Medications: Wellbutrin 150 mg 3 daily, topiramate 100 twice daily, azelastine nasal spray, hydrocodone 5/325 3 daily as needed, Vit D 5000 units daily, levothyroxine 0.25 mg daily, calcitonin nasal spray, aripiprazole 5 mg daily, amitriptyline 10 mg twice daily, naratriptan 2.5  mg as needed, verapamil 240 mg daily, trazodone 50 mg daily   Allergies: doxycycline, Oxycontin, latex  Pharmacy: [facility] [address] [place]

## 2023-07-03 MED ORDER — PEG 3350-KCL-NA BICARB-NACL 420 G PO SOLR: 4000.0000 mL | Freq: Once | ORAL | 0 refills | Status: AC

## 2023-07-03 NOTE — Telephone Encounter (Signed)
Does she have a sling/cast? If so, it should not be a problem as she will be on her left side for the procedure, not on the right side of her body.

## 2023-07-03 NOTE — Telephone Encounter (Signed)
Called pt to schedule procedure. She stated she currently has right broken shoulder. Wants to know if this would be an issue or if she should hold off on procedure right now?

## 2023-07-03 NOTE — Telephone Encounter (Signed)
Questionnaire from recall, no referral needed  

## 2023-07-03 NOTE — Telephone Encounter (Signed)
Called pt and is in sling. She could not do procedure on 9/11 as she has other appt. Booked for 9/18, aware will need 2 day prep. Will send instructions via mychart. Confirmed she did have access.

## 2023-07-16 ENCOUNTER — Encounter (HOSPITAL_COMMUNITY)
Admission: RE | Disposition: A | Discharge status: Home / Self Care | Payer: Self-pay | Source: Home / Self Care | Attending: Gastroenterology

## 2023-07-16 ENCOUNTER — Encounter (HOSPITAL_COMMUNITY): Payer: Self-pay | Admitting: Gastroenterology

## 2023-07-16 ENCOUNTER — Other Ambulatory Visit: Payer: Self-pay

## 2023-07-16 ENCOUNTER — Ambulatory Visit (HOSPITAL_COMMUNITY)
Admission: RE | Admit: 2023-07-16 | Discharge: 2023-07-16 | Disposition: A | Discharge status: Home / Self Care | Payer: Medicare Other | Attending: Gastroenterology | Admitting: Gastroenterology

## 2023-07-16 ENCOUNTER — Ambulatory Visit (HOSPITAL_COMMUNITY): Payer: Medicare Other | Admitting: Certified Registered"

## 2023-07-16 DIAGNOSIS — E039 Hypothyroidism, unspecified: Secondary | ICD-10-CM | POA: Diagnosis not present

## 2023-07-16 DIAGNOSIS — Z85038 Personal history of other malignant neoplasm of large intestine: Secondary | ICD-10-CM | POA: Insufficient documentation

## 2023-07-16 DIAGNOSIS — F419 Anxiety disorder, unspecified: Secondary | ICD-10-CM | POA: Insufficient documentation

## 2023-07-16 DIAGNOSIS — Z7722 Contact with and (suspected) exposure to environmental tobacco smoke (acute) (chronic): Secondary | ICD-10-CM | POA: Diagnosis not present

## 2023-07-16 DIAGNOSIS — K219 Gastro-esophageal reflux disease without esophagitis: Secondary | ICD-10-CM | POA: Diagnosis not present

## 2023-07-16 DIAGNOSIS — G629 Polyneuropathy, unspecified: Secondary | ICD-10-CM | POA: Diagnosis not present

## 2023-07-16 DIAGNOSIS — D123 Benign neoplasm of transverse colon: Secondary | ICD-10-CM | POA: Diagnosis not present

## 2023-07-16 DIAGNOSIS — K648 Other hemorrhoids: Secondary | ICD-10-CM | POA: Diagnosis not present

## 2023-07-16 DIAGNOSIS — I10 Essential (primary) hypertension: Secondary | ICD-10-CM | POA: Insufficient documentation

## 2023-07-16 DIAGNOSIS — Z1211 Encounter for screening for malignant neoplasm of colon: Secondary | ICD-10-CM | POA: Diagnosis not present

## 2023-07-16 DIAGNOSIS — G4730 Sleep apnea, unspecified: Secondary | ICD-10-CM | POA: Insufficient documentation

## 2023-07-16 DIAGNOSIS — F32A Depression, unspecified: Secondary | ICD-10-CM | POA: Diagnosis not present

## 2023-07-16 DIAGNOSIS — Z860100 Personal history of colon polyps, unspecified: Secondary | ICD-10-CM

## 2023-07-16 HISTORY — PX: COLONOSCOPY WITH PROPOFOL: SHX5780

## 2023-07-16 HISTORY — PX: POLYPECTOMY: SHX5525

## 2023-07-16 LAB — HM COLONOSCOPY

## 2023-07-16 SURGERY — COLONOSCOPY WITH PROPOFOL
Anesthesia: General

## 2023-07-16 MED ORDER — LACTATED RINGERS IV SOLN
INTRAVENOUS | Status: DC
  Administered 2023-07-16: 1000 mL via INTRAVENOUS

## 2023-07-16 MED ORDER — PROPOFOL 1000 MG/100ML IV EMUL
INTRAVENOUS | Status: AC
  Filled 2023-07-16: qty 100

## 2023-07-16 MED ORDER — DEXMEDETOMIDINE HCL IN NACL 80 MCG/20ML IV SOLN
INTRAVENOUS | Status: AC
  Filled 2023-07-16: qty 20

## 2023-07-16 MED ORDER — LACTATED RINGERS IV SOLN: INTRAVENOUS | Status: DC

## 2023-07-16 MED ORDER — LIDOCAINE HCL (CARDIAC) PF 100 MG/5ML IV SOSY
PREFILLED_SYRINGE | INTRAVENOUS | Status: DC | PRN
  Administered 2023-07-16: 80 mg via INTRAVENOUS

## 2023-07-16 MED ORDER — PROPOFOL 10 MG/ML IV BOLUS
INTRAVENOUS | Status: DC | PRN
  Administered 2023-07-16: 80 mg via INTRAVENOUS

## 2023-07-16 MED ORDER — DEXMEDETOMIDINE HCL IN NACL 80 MCG/20ML IV SOLN
INTRAVENOUS | Status: DC | PRN
  Administered 2023-07-16: 6 ug via INTRAVENOUS

## 2023-07-16 MED ORDER — PROPOFOL 500 MG/50ML IV EMUL
INTRAVENOUS | Status: DC | PRN
  Administered 2023-07-16: 150 ug/kg/min via INTRAVENOUS

## 2023-07-16 MED ORDER — STERILE WATER FOR IRRIGATION IR SOLN
Status: DC | PRN
  Administered 2023-07-16: 180 mL

## 2023-07-16 MED ORDER — LIDOCAINE HCL (PF) 2 % IJ SOLN
INTRAMUSCULAR | Status: AC
  Filled 2023-07-16: qty 15

## 2023-07-16 NOTE — H&P (Signed)
Victoria Bailey is an 67 y.o. female.   Chief Complaint: History of piecemeal polypectomy, early mucosal adenocarcinoma. HPI: Victoria Bailey is a 67 y.o. female with past medical history of anxiety, depression, HTN, GERD, neuropathy, osteoporosis, sleep apnea, who comes for follow-up of History of piecemeal polypectomy, early mucosal adenocarcinoma.  The patient denies having any nausea, vomiting, fever, chills, hematochezia, melena, hematemesis, abdominal distention, abdominal pain, diarrhea, jaundice, pruritus or weight loss.  Last colonoscopy was performed in 12/12/2022.  She had 2 small polyps removed from the transverse colon, there was a 15 mm polyp removed from the descending colon removed in a piecemeal fashion.  Notably, the large polyp had high-grade dysplasia and was focally suspicious for early intramucosal adenocarcinoma.  Past Medical History:  Diagnosis Date   Anxiety    Chronic pain    Depression    Essential hypertension, benign 08/26/2018   Foot drop    GERD (gastroesophageal reflux disease)    Hypothyroid    Migraine    miograines, takes botox as well for these   Neuropathy    Osteoporosis    S/P hip replacement 02/20/2012   Right bipolar    Sleep apnea    cannot tolerate CPAP, "causes migraines to be worse" PCP aware.    Past Surgical History:  Procedure Laterality Date   ABDOMINAL HYSTERECTOMY     back stimulator     BACK SURGERY     laminectomy and fusion X2 surgeries   BIOPSY  12/12/2022   Procedure: BIOPSY;  Surgeon: Marguerita Merles, Reuel Boom, MD;  Location: [facility];  Service: Gastroenterology;;   CHOLECYSTECTOMY     COLONOSCOPY WITH PROPOFOL N/A 10/09/2018   Procedure: COLONOSCOPY WITH PROPOFOL;  Surgeon: Malissa Hippo, MD;  Location: [facility];  Service: Endoscopy;  Laterality: N/A;  1:05   COLONOSCOPY WITH PROPOFOL N/A 12/12/2022   Procedure: COLONOSCOPY WITH PROPOFOL;  Surgeon: Dolores Frame, MD;  Location: [facility];   Service: Gastroenterology;  Laterality: N/A;  10:45am, asa 2   HIP SURGERY     rt bipolar 10.26.2011   JOINT REPLACEMENT     NEPHRECTOMY Left    due to invasion of endometreosis   PLANTAR FASCIA RELEASE Right    POLYPECTOMY  10/09/2018   Procedure: POLYPECTOMY;  Surgeon: Malissa Hippo, MD;  Location: [facility];  Service: Endoscopy;;  (colon)   POLYPECTOMY  12/12/2022   Procedure: POLYPECTOMY;  Surgeon: Dolores Frame, MD;  Location: [facility];  Service: Gastroenterology;;   Susa Day  12/12/2022   Procedure: Susa Day;  Surgeon: Dolores Frame, MD;  Location: [facility];  Service: Gastroenterology;;   SUBMUCOSAL TATTOO INJECTION  12/12/2022   Procedure: SUBMUCOSAL TATTOO INJECTION;  Surgeon: Dolores Frame, MD;  Location: [facility];  Service: Gastroenterology;;    History reviewed. No pertinent family history. Social History:  reports that she has never smoked. She has been exposed to tobacco smoke. She has never used smokeless tobacco. She reports that she does not drink alcohol and does not use drugs.  Allergies:  Allergies  Allergen Reactions   Latex Other (See Comments)    Reaction unknown-per patient, MD confirmed the allergy   Oxycodone Hcl Nausea And Vomiting   Oxycontin [Oxycodone]     Nausea vomiting   Sulfa Drugs Cross Reactors Nausea And Vomiting   Zonisamide Other (See Comments)   Doxycycline Hives and Rash    Facility-Administered Medications Prior to Admission  Medication Dose Route Frequency Provider  Last Rate Last Admin   methylPREDNISolone acetate (DEPO-MEDROL) injection 40 mg  40 mg Intra-articular Once Vickki Hearing, MD       methylPREDNISolone acetate (DEPO-MEDROL) injection 40 mg  40 mg Intra-articular Once Vickki Hearing, MD       methylPREDNISolone acetate (DEPO-MEDROL) injection 40 mg  40 mg Intra-articular Once Vickki Hearing, MD       Medications Prior to Admission  Medication Sig  Dispense Refill   amitriptyline (ELAVIL) 10 MG tablet Take 10 mg by mouth at bedtime.     ARIPiprazole (ABILIFY) 5 MG tablet Take 5 mg by mouth daily.     buPROPion (WELLBUTRIN XL) 150 MG 24 hr tablet Take 150 mg by mouth 3 (three) times daily.  1   calcitonin, salmon, (MIACALCIN/FORTICAL) 200 UNIT/ACT nasal spray Place 1 spray into alternate nostrils daily.     Calcium Carb-Cholecalciferol (CALCIUM 600/VITAMIN D3 PO) Take 1 tablet by mouth 2 (two) times daily.     cetirizine (ZYRTEC) 10 MG tablet Take 10 mg by mouth daily as needed for allergies.      esomeprazole (NEXIUM) 40 MG capsule Take 40 mg by mouth daily.      HYDROcodone-acetaminophen (NORCO/VICODIN) 5-325 MG tablet Take 1 tablet by mouth 3 (three) times daily as needed for moderate pain.     levothyroxine (SYNTHROID) 25 MCG tablet Take 25 mcg by mouth daily before breakfast.     naratriptan (AMERGE) 2.5 MG tablet Take 2.5 mg by mouth daily as needed for migraine.   11   topiramate (TOPAMAX) 100 MG tablet Take 100 mg by mouth 2 (two) times daily.     verapamil (CALAN-SR) 240 MG CR tablet Take 240 mg by mouth daily.  3   azelastine (ASTELIN) 0.1 % nasal spray Place 2 sprays into both nostrils daily as needed for rhinitis.      Cholecalciferol 125 MCG (5000 UT) TABS Take 5,000 Units by mouth daily.     naloxone (NARCAN) nasal spray 4 mg/0.1 mL Place 1 spray into the nose once.     sertraline (ZOLOFT) 25 MG tablet Take 25 mg by mouth daily.      No results found for this or any previous visit (from the past 48 hour(s)). No results found.  Review of Systems  All other systems reviewed and are negative.   Blood pressure (!) 176/80, pulse 75, temperature 98.2 F (36.8 C), temperature source Oral, resp. rate 11, height 5\' 7"  (1.702 m), weight 77.1 kg, SpO2 97%. Physical Exam  GENERAL: The patient is AO x3, in no acute distress. HEENT: Head is normocephalic and atraumatic. EOMI are intact. Mouth is well hydrated and without  lesions. NECK: Supple. No masses LUNGS: Clear to auscultation. No presence of rhonchi/wheezing/rales. Adequate chest expansion HEART: RRR, normal s1 and s2. ABDOMEN: Soft, nontender, no guarding, no peritoneal signs, and nondistended. BS +. No masses. EXTREMITIES: Without any cyanosis, clubbing, rash, lesions or edema. NEUROLOGIC: AOx3, no focal motor deficit. SKIN: no jaundice, no rashes  Assessment/Plan Victoria Bailey is a 67 y.o. female with past medical history of anxiety, depression, HTN, GERD, neuropathy, osteoporosis, sleep apnea, who comes for follow-up of History of piecemeal polypectomy, early mucosal adenocarcinoma.  Will proceed with colonoscopy.  Dolores Frame, MD 07/16/2023, 8:31 AM

## 2023-07-16 NOTE — Op Note (Signed)
[facility] Patient Name: Victoria Bailey Procedure Date: 07/16/2023 8:24 AM MRN: [id number] Date of Birth: 1956/06/05 Attending MD: Katrinka Blazing , , [id number] CSN: [id number] Age: 67 Admit Type: Outpatient Procedure:                Colonoscopy Indications:              High risk colon cancer surveillance: Personal                            history of intramucosal carcinoma - piecemeal                            resection Providers:                Katrinka Blazing, Nena Polio, RN, Elinor Parkinson Referring MD:              Medicines:                Monitored Anesthesia Care Complications:            No immediate complications. Estimated Blood Loss:     Estimated blood loss: none. Procedure:                Pre-Anesthesia Assessment:                           - Prior to the procedure, a History and Physical                            was performed, and patient medications, allergies                            and sensitivities were reviewed. The patient's                            tolerance of previous anesthesia was reviewed.                           - The risks and benefits of the procedure and the                            sedation options and risks were discussed with the                            patient. All questions were answered and informed                            consent was obtained.                           - ASA Grade Assessment: II - A patient with mild                            systemic disease.                           - Adequate visualization was aided with the use of  a transparent cap attached to the distal part of                            the endoscope.                           After obtaining informed consent, the colonoscope                            was passed under direct vision. Throughout the                            procedure, the patient's blood pressure, pulse, and                            oxygen  saturations were monitored continuously. The                            [id number]-[id number] ([id number]) scope was introduced through                            the anus and advanced to the the cecum, identified                            by appendiceal orifice and ileocecal valve. The                            colonoscopy was performed without difficulty. The                            patient tolerated the procedure well. The quality                            of the bowel preparation was excellent. Scope In: 8:39:27 AM Scope Out: 8:59:03 AM Scope Withdrawal Time: 0 hours 15 minutes 6 seconds  Total Procedure Duration: 0 hours 19 minutes 36 seconds  Findings:      The perianal and digital rectal examinations were normal.      Two sessile polyps were found in the transverse colon. The polyps were 2       to 3 mm in size. These polyps were removed with a cold snare. Resection       and retrieval were complete.      A tattoo was seen in the descending colon. A scar was found 1cm proximal       and contralateral to the tattoo site. Imaging was performed using white       light and narrow band imaging to visualize the mucosa. There was no       evidence of residual polyp.      Non-bleeding internal hemorrhoids were found during retroflexion. The       hemorrhoids were medium-sized. Impression:               - Two 2 to 3 mm polyps in the transverse colon,  removed with a cold snare. Resected and retrieved.                           - A tattoo was seen in the descending colon. A scar                            was found at the tattoo site.                           - Non-bleeding internal hemorrhoids. Moderate Sedation:      Per Anesthesia Care Recommendation:           - Discharge patient to home (ambulatory).                           - Resume previous diet.                           - Await pathology results.                           - Repeat colonoscopy in 3 years for  surveillance. Procedure Code(s):        --- Professional ---                           [phone], Colonoscopy, flexible; with removal of                            tumor(s), polyp(s), or other lesion(s) by snare                            technique Diagnosis Code(s):        --- Professional ---                           U54.270, Personal history of other malignant                            neoplasm of large intestine                           D12.3, Benign neoplasm of transverse colon (hepatic                            flexure or splenic flexure)                           K64.8, Other hemorrhoids CPT copyright 2022 [employer]. All rights reserved. The codes documented in this report are preliminary and upon coder review may  be revised to meet current compliance requirements. Katrinka Blazing, MD Katrinka Blazing,  07/16/2023 9:07:59 AM This report has been signed electronically. Number of Addenda: 0

## 2023-07-16 NOTE — Discharge Instructions (Signed)
You are being discharged to home.  Resume your previous diet.  We are waiting for your pathology results.  Your physician has recommended a repeat colonoscopy in three years for surveillance.  

## 2023-07-16 NOTE — Anesthesia Preprocedure Evaluation (Signed)
Anesthesia Evaluation  Patient identified by MRN, date of birth, ID band Patient awake    Reviewed: Allergy & Precautions, H&P , NPO status , Patient's Chart, lab work & pertinent test results, reviewed documented beta blocker date and time   Airway Mallampati: II  TM Distance: >3 FB Neck ROM: full    Dental no notable dental hx.    Pulmonary neg pulmonary ROS, sleep apnea    Pulmonary exam normal breath sounds clear to auscultation       Cardiovascular Exercise Tolerance: Good hypertension, negative cardio ROS  Rhythm:regular Rate:Normal     Neuro/Psych  Headaches PSYCHIATRIC DISORDERS Anxiety Depression     Neuromuscular disease negative neurological ROS  negative psych ROS   GI/Hepatic negative GI ROS, Neg liver ROS,GERD  ,,  Endo/Other  negative endocrine ROSHypothyroidism    Renal/GU negative Renal ROS  negative genitourinary   Musculoskeletal   Abdominal   Peds  Hematology negative hematology ROS (+)   Anesthesia Other Findings   Reproductive/Obstetrics negative OB ROS                             Anesthesia Physical Anesthesia Plan  ASA: 2  Anesthesia Plan: General   Post-op Pain Management:    Induction:   PONV Risk Score and Plan: Propofol infusion  Airway Management Planned:   Additional Equipment:   Intra-op Plan:   Post-operative Plan:   Informed Consent: I have reviewed the patients History and Physical, chart, labs and discussed the procedure including the risks, benefits and alternatives for the proposed anesthesia with the patient or authorized representative who has indicated his/her understanding and acceptance.     Dental Advisory Given  Plan Discussed with: CRNA  Anesthesia Plan Comments:        Anesthesia Quick Evaluation

## 2023-07-16 NOTE — Anesthesia Postprocedure Evaluation (Signed)
Anesthesia Post Note  Patient: Victoria Bailey  Procedure(s) Performed: COLONOSCOPY WITH PROPOFOL POLYPECTOMY  Patient location during evaluation: Phase II Anesthesia Type: General Level of consciousness: awake Pain management: pain level controlled Vital Signs Assessment: post-procedure vital signs reviewed and stable Respiratory status: spontaneous breathing and respiratory function stable Cardiovascular status: blood pressure returned to baseline and stable Postop Assessment: no headache and no apparent nausea or vomiting Anesthetic complications: no Comments: Late entry   No notable events documented.   Last Vitals:  Vitals:   07/16/23 0733 07/16/23 0905  BP: (!) 176/80 (!) 144/76  Pulse: 75 68  Resp: 11 18  Temp: 36.8 C 36.4 C  SpO2: 97% 98%    Last Pain:  Vitals:   07/16/23 0905  TempSrc: Oral  PainSc: 0-No pain                 Windell Norfolk

## 2023-07-16 NOTE — Transfer of Care (Addendum)
Immediate Anesthesia Transfer of Care Note  Patient: Victoria Bailey  Procedure(s) Performed: COLONOSCOPY WITH PROPOFOL POLYPECTOMY  Patient Location: Short Stay  Anesthesia Type:General  Level of Consciousness: drowsy and patient cooperative  Airway & Oxygen Therapy: Patient Spontanous Breathing and Patient connected to nasal cannula oxygen  Post-op Assessment: Report given to RN and Post -op Vital signs reviewed and stable  Post vital signs: Reviewed and stable  Last Vitals:  Vitals Value Taken Time  BP 144/76 07/16/23   0905  Temp 36.4 07/16/23   0905  Pulse 68 07/16/23   0905  Resp 18 07/16/23   0905  SpO2 98% 07/16/23   0905    Last Pain:  Vitals:   07/16/23 0831  TempSrc:   PainSc: 0-No pain      Patients Stated Pain Goal: 7 (07/16/23 0831)  Complications: No notable events documented.

## 2023-07-17 ENCOUNTER — Encounter (INDEPENDENT_AMBULATORY_CARE_PROVIDER_SITE_OTHER): Payer: Self-pay | Admitting: *Deleted

## 2023-07-17 LAB — SURGICAL PATHOLOGY

## 2023-07-21 ENCOUNTER — Encounter (INDEPENDENT_AMBULATORY_CARE_PROVIDER_SITE_OTHER): Payer: Self-pay | Admitting: *Deleted

## 2023-07-24 ENCOUNTER — Encounter (HOSPITAL_COMMUNITY): Payer: Self-pay | Admitting: Gastroenterology

## 2024-05-24 ENCOUNTER — Emergency Department (HOSPITAL_COMMUNITY)

## 2024-05-24 ENCOUNTER — Encounter (HOSPITAL_COMMUNITY): Payer: Self-pay | Admitting: Emergency Medicine

## 2024-05-24 ENCOUNTER — Emergency Department (HOSPITAL_COMMUNITY)
Admission: EM | Admit: 2024-05-24 | Discharge: 2024-05-24 | Disposition: A | Discharge status: Home / Self Care | Attending: Emergency Medicine | Admitting: Emergency Medicine

## 2024-05-24 ENCOUNTER — Other Ambulatory Visit: Payer: Self-pay

## 2024-05-24 DIAGNOSIS — Y93K1 Activity, walking an animal: Secondary | ICD-10-CM | POA: Diagnosis not present

## 2024-05-24 DIAGNOSIS — M25551 Pain in right hip: Secondary | ICD-10-CM | POA: Diagnosis present

## 2024-05-24 DIAGNOSIS — Z23 Encounter for immunization: Secondary | ICD-10-CM | POA: Insufficient documentation

## 2024-05-24 DIAGNOSIS — W101XXA Fall (on)(from) sidewalk curb, initial encounter: Secondary | ICD-10-CM | POA: Diagnosis not present

## 2024-05-24 DIAGNOSIS — Z91040 Latex allergy status: Secondary | ICD-10-CM | POA: Insufficient documentation

## 2024-05-24 DIAGNOSIS — Z96641 Presence of right artificial hip joint: Secondary | ICD-10-CM | POA: Diagnosis not present

## 2024-05-24 DIAGNOSIS — E039 Hypothyroidism, unspecified: Secondary | ICD-10-CM | POA: Insufficient documentation

## 2024-05-24 DIAGNOSIS — W19XXXA Unspecified fall, initial encounter: Secondary | ICD-10-CM

## 2024-05-24 DIAGNOSIS — I10 Essential (primary) hypertension: Secondary | ICD-10-CM | POA: Diagnosis not present

## 2024-05-24 LAB — BASIC METABOLIC PANEL WITH GFR
Anion gap: 9 (ref 5–15)
BUN: 14 mg/dL (ref 8–23)
CO2: 24 mmol/L (ref 22–32)
Calcium: 9.2 mg/dL (ref 8.9–10.3)
Chloride: 109 mmol/L (ref 98–111)
Creatinine, Ser: 0.91 mg/dL (ref 0.44–1.00)
GFR, Estimated: >60 mL/min (ref >60)
Glucose, Bld: 99 mg/dL (ref 70–99)
Potassium: 4.1 mmol/L (ref 3.5–5.1)
Sodium: 142 mmol/L (ref 135–145)

## 2024-05-24 LAB — CBC WITH DIFFERENTIAL/PLATELET
Abs Immature Granulocytes: 0.02 K/uL (ref 0.00–0.07)
Basophils Absolute: 0 K/uL (ref 0.0–0.1)
Basophils Relative: 0 %
Eosinophils Absolute: 0.1 K/uL (ref 0.0–0.5)
Eosinophils Relative: 1 %
HCT: 40.5 % (ref 36.0–46.0)
Hemoglobin: 13.1 g/dL (ref 12.0–15.0)
Immature Granulocytes: 0 %
Lymphocytes Relative: 14 %
Lymphs Abs: 0.9 K/uL (ref 0.7–4.0)
MCH: 30.6 pg (ref 26.0–34.0)
MCHC: 32.3 g/dL (ref 30.0–36.0)
MCV: 94.6 fL (ref 80.0–100.0)
Monocytes Absolute: 0.4 K/uL (ref 0.1–1.0)
Monocytes Relative: 7 %
Neutro Abs: 4.7 K/uL (ref 1.7–7.7)
Neutrophils Relative %: 78 %
Platelets: 234 K/uL (ref 150–400)
RBC: 4.28 MIL/uL (ref 3.87–5.11)
RDW: 13.1 % (ref 11.5–15.5)
WBC: 6.1 K/uL (ref 4.0–10.5)
nRBC: 0 % (ref 0.0–0.2)

## 2024-05-24 MED ORDER — HYDROCODONE-ACETAMINOPHEN 5-325 MG PO TABS
2.0000 | ORAL_TABLET | Freq: Once | ORAL | Status: AC
  Administered 2024-05-24: 2 via ORAL
  Filled 2024-05-24: qty 2

## 2024-05-24 MED ORDER — METHOCARBAMOL 500 MG PO TABS: 500.0000 mg | ORAL_TABLET | Freq: Three times a day (TID) | ORAL | 0 refills | Status: DC | PRN

## 2024-05-24 MED ORDER — CYCLOBENZAPRINE HCL 10 MG PO TABS
10.0000 mg | ORAL_TABLET | Freq: Once | ORAL | Status: AC
  Administered 2024-05-24: 10 mg via ORAL
  Filled 2024-05-24: qty 1

## 2024-05-24 MED ORDER — MELOXICAM 7.5 MG PO TABS: 7.5000 mg | ORAL_TABLET | Freq: Every day | ORAL | 0 refills | Status: AC

## 2024-05-24 MED ORDER — TETANUS-DIPHTH-ACELL PERTUSSIS 5-2.5-18.5 LF-MCG/0.5 IM SUSY
0.5000 mL | PREFILLED_SYRINGE | Freq: Once | INTRAMUSCULAR | Status: AC
  Administered 2024-05-24: 0.5 mL via INTRAMUSCULAR
  Filled 2024-05-24: qty 0.5

## 2024-05-24 MED ORDER — LIDOCAINE 5 % EX PTCH
1.0000 | MEDICATED_PATCH | Freq: Once | CUTANEOUS | Status: DC
  Administered 2024-05-24: 1 via TRANSDERMAL
  Filled 2024-05-24: qty 1

## 2024-05-24 MED ORDER — HYDROCODONE-ACETAMINOPHEN 5-325 MG PO TABS
1.0000 | ORAL_TABLET | Freq: Four times a day (QID) | ORAL | 0 refills | Status: AC | PRN
Start: 2024-05-24 — End: 2024-05-29

## 2024-05-24 MED ORDER — LIDOCAINE 5 % EX PTCH: 1.0000 | MEDICATED_PATCH | CUTANEOUS | 0 refills | Status: AC

## 2024-05-24 NOTE — Discharge Instructions (Addendum)
 Keep your appointment with Dr. Margrette on Thursday.  It is okay to put weight on your right leg but you should do so gently.  Avoid any movements or activities that significantly worsen your pain.  Prescriptions were sent to your pharmacy: - Meloxicam  is an NSAID medication.  Take this in place of over-the-counter NSAIDs - Methocarbamol  as a muscle relaxer.  Take this as needed. -Lidocaine  patches can be switched daily. - Hydrocodone  is a narcotic.  Take only as needed.  Return to the emergency department for any new or worsening symptoms of concern.

## 2024-05-24 NOTE — ED Triage Notes (Signed)
 RT hip and RT pelvic pain after tripping over a curb and falling today

## 2024-05-24 NOTE — ED Notes (Signed)
 Patient transported to CT

## 2024-05-24 NOTE — ED Provider Notes (Signed)
 [facility] Bailey EMERGENCY DEPARTMENT AT [facility] Provider Note  CSN: [id number] Arrival date & time: 05/24/24 1249  Chief Complaint(s) Hip Pain  HPI Victoria Bailey is a 68 y.o. female with past medical history as below, significant for hypertension, right hip replacement, sleep apnea, chronic pain who presents to the ED with complaint of fall, right hip pain  Patient reports prior to arrival she was walking her dog, she tripped over the dog leash and fell onto the curb.  Fell onto her right hip, right forearm, right knee.  Initially able ambulate but the pain has since worsened and she is now having severe difficulty with ambulation secondary to worsening pain to her right hip.  Head injury, no LOC, no thinners.  No numbness or tingling to her right lower extremity.  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 Authorizing Provider  amitriptyline  (ELAVIL ) 10 MG tablet Take 10 mg by mouth at bedtime.    [provider]  ARIPiprazole (ABILIFY) 5 MG tablet Take 5 mg by mouth daily.    [provider]  azelastine (ASTELIN) 0.1 % nasal spray Place 2 sprays into both nostrils daily as needed for rhinitis.     [provider]  buPROPion (WELLBUTRIN XL) 150 MG 24 hr tablet Take 150 mg by mouth 3 (three) times daily. 09/05/18   [provider]  calcitonin, salmon, (MIACALCIN/FORTICAL) 200 UNIT/ACT nasal spray Place 1 spray into alternate nostrils daily.    [provider]  Calcium Carb-Cholecalciferol (CALCIUM 600/VITAMIN D3 PO) Take 1 tablet by mouth 2 (two) times daily.    [provider]  cetirizine (ZYRTEC) 10 MG tablet Take 10 mg by mouth daily as needed for allergies.     [provider]  Cholecalciferol 125 MCG (5000 UT) TABS Take 5,000 Units by mouth daily.    [provider]  esomeprazole (NEXIUM) 40 MG capsule Take 40 mg by mouth daily.     [provider]  HYDROcodone -acetaminophen  (NORCO/VICODIN) 5-325 MG tablet Take 1 tablet by mouth 3 (three) times daily as needed for moderate pain.    [provider]  levothyroxine (SYNTHROID) 25 MCG tablet Take 25 mcg  by mouth daily before breakfast. 09/26/22   [provider]  naloxone [facility]) nasal spray 4 mg/0.1 mL Place 1 spray into the nose once. 10/10/20   [provider]  naratriptan (AMERGE) 2.5 MG tablet Take 2.5 mg by mouth daily as needed for migraine.  08/25/18   [provider]  sertraline (ZOLOFT) 25 MG tablet Take 25 mg by mouth daily. 12/06/22   [provider]  topiramate (TOPAMAX) 100 MG tablet Take 100 mg by mouth 2 (two) times daily.    [provider]  verapamil (CALAN-SR) 240 MG CR tablet  Take 240 mg by mouth daily. 08/25/18   [provider]                                                                                                                                    Past Surgical History Past Surgical History:  Procedure Laterality Date   ABDOMINAL HYSTERECTOMY     back stimulator     BACK SURGERY     laminectomy and fusion X2 surgeries   BIOPSY  12/12/2022   Procedure: BIOPSY;  Surgeon: Eartha Angelia Sieving, MD;  Location: [facility];  Service: Gastroenterology;;   CHOLECYSTECTOMY     COLONOSCOPY WITH PROPOFOL  N/A 10/09/2018   Procedure: COLONOSCOPY WITH PROPOFOL ;  Surgeon: Golda Claudis PENNER, MD;  Location: [facility];  Service: Endoscopy;  Laterality: N/A;  1:05   COLONOSCOPY WITH PROPOFOL  N/A 12/12/2022   Procedure: COLONOSCOPY WITH PROPOFOL ;  Surgeon: Eartha Angelia Sieving, MD;  [facility]: [facility];  Service: Gastroenterology;  Laterality: N/A;  10:45am, asa 2   COLONOSCOPY WITH PROPOFOL  N/A 07/16/2023   Procedure: COLONOSCOPY WITH PROPOFOL ;  Surgeon: Eartha Angelia Sieving, MD;  [facility]: [facility];  Service: Gastroenterology;  Laterality: N/A;  11:15am, asa 1-2   HIP SURGERY     rt bipolar 10.26.2011   JOINT REPLACEMENT     NEPHRECTOMY Left    due to invasion of endometreosis   PLANTAR FASCIA RELEASE Right    POLYPECTOMY  10/09/2018   Procedure: POLYPECTOMY;  Surgeon: Golda Claudis PENNER, MD;  [facility]: [facility];  Service: Endoscopy;;  (colon)   POLYPECTOMY  12/12/2022   Procedure: POLYPECTOMY;  Surgeon: Eartha Angelia Sieving, MD;  [facility]: [facility];  Service: Gastroenterology;;   POLYPECTOMY  07/16/2023   Procedure: POLYPECTOMY;  Surgeon: Eartha Angelia Sieving, MD;  [facility]: [facility];  Service: Gastroenterology;;   MATIAS  12/12/2022   Procedure: MATIAS;  Surgeon: Eartha Angelia Sieving, MD;  [facility]: [facility];  Service: Gastroenterology;;   SUBMUCOSAL TATTOO INJECTION  12/12/2022    Procedure: SUBMUCOSAL TATTOO INJECTION;  Surgeon: Eartha Angelia Sieving, MD;  [facility]: [facility];  Service: Gastroenterology;;   Family History History reviewed. No pertinent family history.  Social History Social History   Tobacco Use   Smoking status: Never    Passive exposure: Current   Smokeless tobacco:  Never  Vaping Use   Vaping status: Never Used  Substance Use Topics   Alcohol use: No   Drug use: No   Allergies Latex, Oxycodone hcl, Oxycontin [oxycodone], Sulfa drugs cross reactors, Zonisamide, and Doxycycline  Review of Systems A thorough review of systems was obtained and all systems are negative except as noted in the HPI and PMH.   Physical Exam Vital Signs  I have reviewed the triage vital signs BP (!) 153/87   Pulse 70   Temp 98 F (36.7 C) (Oral)   Resp 18   Ht 5' 7 (1.702 m)   Wt 77.1 kg   SpO2 100%   BMI 26.63 kg/m  Physical Exam Vitals and nursing note reviewed.  Constitutional:      General: She is not in acute distress.    Appearance: Normal appearance. She is well-developed. She is not ill-appearing.  HENT:     Head: Normocephalic and atraumatic.     Right Ear: External ear normal.     Left Ear: External ear normal.     Nose: Nose normal.     Mouth/Throat:     Mouth: Mucous membranes are moist.  Eyes:     General: No scleral icterus.       Right eye: No discharge.        Left eye: No discharge.  Cardiovascular:     Rate and Rhythm: Normal rate.  Pulmonary:     Effort: Pulmonary effort is normal. No respiratory distress.     Breath sounds: No stridor.  Abdominal:     General: Abdomen is flat. There is no distension.     Tenderness: There is no guarding.  Musculoskeletal:        General: No deformity.       Arms:     Cervical back: No rigidity.       Legs:     Comments: LE NVI Pain with lifting right leg, no sig pain with rom testing to right knee. Palpable dp pulses b/l, cap refill brisk b/l  Skin:    General: Skin  is warm and dry.     Coloration: Skin is not cyanotic, jaundiced or pale.  Neurological:     Mental Status: She is alert and oriented to person, place, and time.     GCS: GCS eye subscore is 4. GCS verbal subscore is 5. GCS motor subscore is 6.  Psychiatric:        Speech: Speech normal.        Behavior: Behavior normal. Behavior is cooperative.     ED Results and Treatments Labs (all labs ordered are listed, but only abnormal results are displayed) Labs Reviewed  CBC WITH DIFFERENTIAL/PLATELET  BASIC METABOLIC PANEL WITH GFR                                                                                                                          Radiology DG Hip Unilat W or Wo Pelvis 2-3  Views Right Result Date: 05/24/2024 CLINICAL DATA:  Fall.  Right hip pain. EXAM: DG HIP (WITH OR WITHOUT PELVIS) 2-3V RIGHT COMPARISON:  08/22/2014. FINDINGS: No evidence of acute fracture or dislocation. Right hip arthroplasty demonstrates normal alignment. Mild osteoarthritis of the left hip. Sacroiliac joints and pubic symphysis are anatomically aligned. Partially visualized postoperative changes of the mid to lower lumbar spine. Surgical clips in the pelvis. IMPRESSION: No acute osseous abnormality.  Intact right hip arthroplasty. Electronically Signed   By: Harrietta Sherry M.D.   On: 05/24/2024 14:11    Pertinent labs & imaging results that were available during my care of the patient were reviewed by me and considered in my medical decision making (see MDM for details).  Medications Ordered in ED Medications  lidocaine  (LIDODERM ) 5 % 1 patch (1 patch Transdermal Patch Applied 05/24/24 1448)  Tdap (BOOSTRIX ) injection 0.5 mL (has no administration in time range)  cyclobenzaprine  (FLEXERIL ) tablet 10 mg (has no administration in time range)  HYDROcodone -acetaminophen  (NORCO/VICODIN) 5-325 MG per tablet 2 tablet (2 tablets Oral Given 05/24/24 1449)                                                                                                                                      Procedures Procedures  (including critical care time)  Medical Decision Making / ED Course    Medical Decision Making:    Victoria Bailey is a 68 y.o. female  with past medical history as below, significant for hypertension, right hip replacement, sleep apnea, chronic pain who presents to the ED with complaint of fall, right hip pain. The complaint involves an extensive differential diagnosis and also carries with it a high risk of complications and morbidity.  Serious etiology was considered. Ddx includes but is not limited to: Fracture, dislocation, sprain, strain, contusion, hematoma, etc.  Complete initial physical exam performed, notably the patient was in no acute distress, HDS.    Reviewed and confirmed nursing documentation for past medical history, family history, social history.  Vital signs reviewed.    Fall w/ hip injury > - There is right side prosthesis, no evidence of fracture on x-ray - Will obtain CT as she is having ongoing pain, possible occult fracture - handoff incoming EDP pending CT and recheck - labs stable                     Additional history obtained: -Additional history obtained from family -External records from outside source obtained and reviewed including: Chart review including previous notes, labs, imaging, consultation notes including  Home medications, primary care documentation   Lab Tests: -I ordered, reviewed, and interpreted labs.   The pertinent results include:   Labs Reviewed  CBC WITH DIFFERENTIAL/PLATELET  BASIC METABOLIC PANEL WITH GFR    Notable for labs stable  EKG   EKG Interpretation Date/Time:    Ventricular Rate:    PR  Interval:    QRS Duration:    QT Interval:    QTC Calculation:   R Axis:      Text Interpretation:           Imaging Studies ordered: I ordered imaging studies including pelvis/hip xr I  independently visualized the following imaging with scope of interpretation limited to determining acute life threatening conditions related to emergency care; findings noted above I agree with the radiologist interpretation If any imaging was obtained with contrast I closely monitored patient for any possible adverse reaction a/w contrast administration in the emergency department   Medicines ordered and prescription drug management: Meds ordered this encounter  Medications   HYDROcodone -acetaminophen  (NORCO/VICODIN) 5-325 MG per tablet 2 tablet    Refill:  0   lidocaine  (LIDODERM ) 5 % 1 patch   Tdap (BOOSTRIX ) injection 0.5 mL   cyclobenzaprine  (FLEXERIL ) tablet 10 mg    -I have reviewed the patients home medicines and have made adjustments as needed   Consultations Obtained: na   Cardiac Monitoring: Continuous pulse oximetry interpreted by myself, 100% on ra.    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      Dispostion: Disposition decision including need for hospitalization was considered, and patient disposition pending at time of sign out.    Final Clinical Impression(s) / ED Diagnoses Final diagnoses:  Right hip pain  Fall, initial encounter        Elnor Jayson LABOR, DO 05/24/24 1558

## 2024-05-24 NOTE — ED Notes (Signed)
 ED Provider at bedside.

## 2024-05-24 NOTE — ED Provider Notes (Signed)
 Care of patient assumed from Dr. Elnor.  This patient presents for right hip pain following a fall.  Has a history of right hip replacement.  X-ray imaging was negative.  MRI not available due to implanted device.  Awaiting CT scan. Physical Exam  BP (!) 142/74   Pulse 64   Temp 98 F (36.7 C) (Oral)   Resp 17   Ht 5' 7 (1.702 m)   Wt 77.1 kg   SpO2 97%   BMI 26.63 kg/m   Physical Exam Vitals and nursing note reviewed.  Constitutional:      General: She is not in acute distress.    Appearance: Normal appearance. She is well-developed. She is not ill-appearing, toxic-appearing or diaphoretic.  HENT:     Head: Normocephalic and atraumatic.     Right Ear: External ear normal.     Left Ear: External ear normal.     Nose: Nose normal.     Mouth/Throat:     Mouth: Mucous membranes are moist.  Eyes:     Extraocular Movements: Extraocular movements intact.     Conjunctiva/sclera: Conjunctivae normal.  Cardiovascular:     Rate and Rhythm: Normal rate and regular rhythm.  Pulmonary:     Effort: Pulmonary effort is normal. No respiratory distress.  Abdominal:     General: Abdomen is flat. There is no distension.  Musculoskeletal:        General: No swelling or deformity.     Cervical back: Normal range of motion and neck supple.  Skin:    General: Skin is warm and dry.     Coloration: Skin is not jaundiced or pale.  Neurological:     General: No focal deficit present.     Mental Status: She is alert and oriented to person, place, and time.  Psychiatric:        Mood and Affect: Mood normal.        Behavior: Behavior normal.     Procedures  Procedures  ED Course / MDM    Medical Decision Making Amount and/or Complexity of Data Reviewed Labs: ordered. Radiology: ordered.  Risk Prescription drug management.   On assessment, patient resting comfortably.  She states that her pain is controlled when at rest.  Pain is worsened with weightbearing on her right leg.  CT scan  showed areas of subtle cortical irregularities concerning for possible pubic ramus/body fractures.  I spoke with orthopedic surgeon on-call, Dr. Onesimo, who recommends protected weightbearing and outpatient follow-up.  Currently, patient does have an appointment scheduled with her orthopedic doctor, Dr. Margrette, on Thursday.  Multimodal pain control was prescribed.  Crutches were provided.  Patient was discharged in stable condition.       Melvenia Motto, MD 05/24/24 [phone]

## 2024-05-25 DIAGNOSIS — S52539A Colles' fracture of unspecified radius, initial encounter for closed fracture: Secondary | ICD-10-CM | POA: Insufficient documentation

## 2024-05-25 DIAGNOSIS — M4802 Spinal stenosis, cervical region: Secondary | ICD-10-CM | POA: Insufficient documentation

## 2024-05-25 DIAGNOSIS — R748 Abnormal levels of other serum enzymes: Secondary | ICD-10-CM | POA: Insufficient documentation

## 2024-05-25 DIAGNOSIS — M5412 Radiculopathy, cervical region: Secondary | ICD-10-CM | POA: Insufficient documentation

## 2024-05-25 DIAGNOSIS — S42224A 2-part nondisplaced fracture of surgical neck of right humerus, initial encounter for closed fracture: Secondary | ICD-10-CM | POA: Insufficient documentation

## 2024-05-27 ENCOUNTER — Encounter: Payer: Self-pay | Admitting: Orthopedic Surgery

## 2024-05-27 ENCOUNTER — Ambulatory Visit: Admitting: Orthopedic Surgery

## 2024-05-27 VITALS — BP 159/81 | HR 82 | Ht 67.0 in | Wt 170.0 lb

## 2024-05-27 DIAGNOSIS — Z96641 Presence of right artificial hip joint: Secondary | ICD-10-CM | POA: Diagnosis not present

## 2024-05-27 DIAGNOSIS — S32511A Fracture of superior rim of right pubis, initial encounter for closed fracture: Secondary | ICD-10-CM | POA: Diagnosis not present

## 2024-05-27 NOTE — Progress Notes (Signed)
   Intake history:  BP (!) 159/81   Pulse 82   Ht 5' 7 (1.702 m)   Wt 170 lb (77.1 kg)   BMI 26.63 kg/m  Body mass index is 26.63 kg/m.    WHAT ARE WE SEEING YOU FOR TODAY?   right hip(s)  How long has this bothered you? (DOI?DOS?WS?)  on  05/24/24 fell with pelvic pain right / history of right hip replacement on approx 2011 or 2012 also history of lumbar fusion but states pain right pelvic/ groin area   Anticoag.  No  Diabetes No  Heart disease No  Hypertension Yes  SMOKING HX No  Kidney disease     Latest Ref Rng & Units 05/24/2024    3:05 PM 10/05/2018    9:53 AM 08/25/2010    5:58 AM  CMP  Glucose 70 - 99 mg/dL 99  95  864   BUN 8 - 23 mg/dL 14  10  8    Creatinine 0.44 - 1.00 mg/dL 9.08  8.95  8.87   Sodium 135 - 145 mmol/L 142  141  137   Potassium 3.5 - 5.1 mmol/L 4.1  3.3  3.4   Chloride 98 - 111 mmol/L 109  112  106   CO2 22 - 32 mmol/L 24  22  24    Calcium 8.9 - 10.3 mg/dL 9.2  9.0  8.2      Any ALLERGIES ____________ Allergies  Allergen Reactions   Latex Other (See Comments)    Reaction unknown-per patient, MD confirmed the allergy   Nsaids Other (See Comments)   Oxycodone Hcl Nausea And Vomiting   Oxycontin [Oxycodone]     Nausea vomiting   Sulfa Drugs Cross Reactors Nausea And Vomiting   Zonisamide Other (See Comments)   Doxycycline Hives and Rash   __________________________________   Treatment:  Have you taken:  Tylenol  No  Advil No  Had PT No  Had injection No  Other  ______________Hydrocodone __and Meloxicam  from ER _________

## 2024-05-27 NOTE — Progress Notes (Signed)
   Chief Complaint  Patient presents with   Hip Pain    05/24/24 ER visit    68 year old female with history of right femoral neck fracture treated with bipolar replacement presents after falling injuring her right pelvis with a nondisplaced comminuted fracture superior ramus.  Patient is managing fairly well with crutches complains of right thigh pain  Review of Systems  Constitutional:  Negative for chills, fever and weight loss.  Respiratory:  Negative for shortness of breath.   Cardiovascular:  Negative for chest pain.   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   BP (!) 159/81   Pulse 82   Ht 5' 7 (1.702 m)   Wt 170 lb (77.1 kg)   BMI 26.63 kg/m   Physical Exam Vitals and nursing note reviewed.  Constitutional:      Appearance: Normal appearance.  HENT:     Head: Normocephalic and atraumatic.  Eyes:     General: No scleral icterus.       Right eye: No discharge.        Left eye: No discharge.     Extraocular Movements: Extraocular movements intact.     Conjunctiva/sclera: Conjunctivae normal.     Pupils: Pupils are equal, round, and reactive to light.  Cardiovascular:     Rate and Rhythm: Normal rate.     Pulses: Normal pulses.  Musculoskeletal:     Comments: Patient has a ankle foot orthosis for foot drop related to lumbar spine surgery has no pain on passive range of motion of the right hip but says she has pain when she tries to lift it herself  No other abnormalities.  We do note an external rotation alignment to the right lower extremity which is chronic  Skin:    General: Skin is warm  and dry.     Capillary Refill: Capillary refill takes less than 2 seconds.  Neurological:     Mental Status: She is alert and oriented to person, place, and time.  Psychiatric:        Mood and Affect: Mood normal.        Behavior: Behavior normal.        Thought Content: Thought content normal.        Judgment: Judgment normal.     Images required for evaluation  1.  Right hip.  Bipolar replacement right hip no evidence of periprosthetic fracture alignment looks normal  2.  CT scan pelvis shows comminuted fracture superior ramus right hemipelvis   Assessment and plan  Comminuted fracture superior pubic ramus   Patient can be treated weight-bear as tolerated with supportive device she prefers crutches   X-ray in 6 weeks

## 2024-05-31 ENCOUNTER — Telehealth: Payer: Self-pay | Admitting: Orthopedic Surgery

## 2024-05-31 NOTE — Telephone Encounter (Signed)
 Pt called stating her pains have increased and need a call back when Dr Margrette can. Please call pt at [phone].

## 2024-06-10 ENCOUNTER — Telehealth: Payer: Self-pay | Admitting: Orthopedic Surgery

## 2024-06-10 ENCOUNTER — Other Ambulatory Visit: Payer: Self-pay | Admitting: Orthopedic Surgery

## 2024-06-10 MED ORDER — METHOCARBAMOL 500 MG PO TABS: 500.0000 mg | ORAL_TABLET | Freq: Three times a day (TID) | ORAL | 0 refills | Status: DC | PRN

## 2024-06-10 NOTE — Progress Notes (Signed)
 Meds ordered this encounter  Medications   methocarbamol  (ROBAXIN ) 500 MG tablet    Sig: Take 1 tablet (500 mg total) by mouth every 8 (eight) hours as needed for muscle spasms.    Dispense:  20 tablet    Refill:  0

## 2024-06-10 NOTE — Telephone Encounter (Signed)
 Dr. Areatha pt - spoke w/the pt, she stated that the ED gave her a muscle relaxer, Robaxin  at the hospital.  She would like Dr. VEAR to send more in for her.  She uses [facility] in [place], [place]

## 2024-06-25 ENCOUNTER — Other Ambulatory Visit (INDEPENDENT_AMBULATORY_CARE_PROVIDER_SITE_OTHER): Payer: Self-pay

## 2024-06-25 ENCOUNTER — Ambulatory Visit (INDEPENDENT_AMBULATORY_CARE_PROVIDER_SITE_OTHER): Admitting: Orthopedic Surgery

## 2024-06-25 VITALS — Wt 182.0 lb

## 2024-06-25 DIAGNOSIS — S32591A Other specified fracture of right pubis, initial encounter for closed fracture: Secondary | ICD-10-CM | POA: Diagnosis not present

## 2024-06-25 DIAGNOSIS — M545 Low back pain, unspecified: Secondary | ICD-10-CM

## 2024-06-25 MED ORDER — METHOCARBAMOL 750 MG PO TABS: 750.0000 mg | ORAL_TABLET | Freq: Four times a day (QID) | ORAL | 2 refills | Status: DC

## 2024-06-25 NOTE — Progress Notes (Signed)
 Chief Complaint  Patient presents with   Hip Pain    Left hip pain- starting hurting Monday PM   DOI: 05/24/24  Encounter Diagnosis  Name Primary?   Closed fracture of right inferior pubic ramus, initial encounter (HCC) Yes   Unscheduled visit for pain left hip  Patient is followed for chronic pain has spine stimulator has had previous lumbar fusion  Currently undergoing treatment for right pubic ramus fracture which is healing appropriately and pain has decreased there significantly  The patient may have had a minor trauma to her left lower back when she was bending over and the seat she was in gave way  Timing unclear patient does not remember exactly when that happened but she has noticed increased lower back and left hip pain  Right hip exam now is normal  Left hip range of motion is normal but she has tenderness in the lower back buttock and lateral side of the left hip  DG Pelvis 1-2 Views Result Date: 06/25/2024 Pelvis x-ray Follow-up superior pubic ramus fracture x-ray shows callus near the superior pubic ramus consistent with fracture Prosthesis right hip stable Impression healing superior pubic ramus fracture right hemipelvis      CT SCAN IMPRESSION: 1. Subtle cortical irregularities in the right superior pubic ramus raise suspicion for superior pubic ramus fractures with questionable extension into the right pubic body. 2. No visible periprosthetic fracture in the femur. 3. Mild regional muscular atrophy.     Electronically Signed   By: Ryan Salvage M.D.   On: 05/24/2024 16:42    The right pelvic fracture has healed enough to release her regarding that  Recommend she increase her Robaxin  to 750 mg and see her chronic pain management specialist for ongoing back issues as we do not handle those type of problems in this office

## 2024-06-25 NOTE — Progress Notes (Signed)
   Wt 182 lb (82.6 kg)   BMI 28.51 kg/m   Body mass index is 28.51 kg/m.  Chief Complaint  Patient presents with   Hip Pain    Left hip pain- starting hurting Monday PM    Encounter Diagnosis  Name Primary?   Closed fracture of right inferior pubic ramus, initial encounter (HCC) Yes

## 2024-06-30 ENCOUNTER — Encounter: Admitting: Orthopedic Surgery

## 2024-07-01 ENCOUNTER — Emergency Department (HOSPITAL_COMMUNITY)

## 2024-07-01 ENCOUNTER — Emergency Department (HOSPITAL_COMMUNITY)
Admission: EM | Admit: 2024-07-01 | Discharge: 2024-07-01 | Disposition: A | Discharge status: Home / Self Care | Attending: Emergency Medicine | Admitting: Emergency Medicine

## 2024-07-01 ENCOUNTER — Encounter (HOSPITAL_COMMUNITY): Payer: Self-pay

## 2024-07-01 ENCOUNTER — Other Ambulatory Visit: Payer: Self-pay

## 2024-07-01 DIAGNOSIS — M5441 Lumbago with sciatica, right side: Secondary | ICD-10-CM | POA: Insufficient documentation

## 2024-07-01 DIAGNOSIS — M5431 Sciatica, right side: Secondary | ICD-10-CM

## 2024-07-01 DIAGNOSIS — Z91040 Latex allergy status: Secondary | ICD-10-CM | POA: Insufficient documentation

## 2024-07-01 DIAGNOSIS — M545 Low back pain, unspecified: Secondary | ICD-10-CM | POA: Diagnosis present

## 2024-07-01 MED ORDER — HYDROCODONE-ACETAMINOPHEN 5-325 MG PO TABS
2.0000 | ORAL_TABLET | Freq: Once | ORAL | Status: AC
  Administered 2024-07-01: 2 via ORAL
  Filled 2024-07-01: qty 2

## 2024-07-01 MED ORDER — PREDNISONE 50 MG PO TABS
60.0000 mg | ORAL_TABLET | Freq: Once | ORAL | Status: AC
  Administered 2024-07-01: 60 mg via ORAL
  Filled 2024-07-01: qty 1

## 2024-07-01 MED ORDER — OXYCODONE-ACETAMINOPHEN 5-325 MG PO TABS: 1.0000 | ORAL_TABLET | Freq: Four times a day (QID) | ORAL | 0 refills | Status: AC | PRN

## 2024-07-01 MED ORDER — PREDNISONE 10 MG PO TABS: ORAL_TABLET | ORAL | 0 refills | Status: AC

## 2024-07-01 MED ORDER — ONDANSETRON 4 MG PO TBDP
4.0000 mg | ORAL_TABLET | Freq: Three times a day (TID) | ORAL | 0 refills | Status: AC | PRN
Start: 2024-07-01 — End: ?

## 2024-07-01 NOTE — ED Triage Notes (Signed)
 Pt arrived via POV from home c/o lower back pain that Pt reports radiates down her legs. Pt reports sitting on her shower chair and the webbing of the seat broke causing her to fall on her lower back, buttocks on the shower floor. Pt reports recent falls as well and ambulates with crutches and assistance.

## 2024-07-01 NOTE — ED Notes (Signed)
 ED Provider at bedside.

## 2024-07-01 NOTE — ED Notes (Signed)
 Assisted pt to BR with use of walker and back to room.

## 2024-07-01 NOTE — ED Notes (Signed)
 Pt returned from CT

## 2024-07-01 NOTE — Discharge Instructions (Signed)
 Your CT is reassuring today as discussed with no obvious new injury from your recent fall.  I am prescribing you Percocet to take in place of your hydrocodone  which is a stronger pain reliever.  In the event this makes you nauseous have also prescribed you a nausea medication called Zofran .  You have also been prescribed a prednisone  taper, take your first prescription dose tomorrow as you have received a dose here today.  Use a heating pad for 20 minutes 4 times daily to your lower back.  You may also alternate with ice packs as much as is comfortable.

## 2024-07-01 NOTE — ED Provider Notes (Signed)
 [place] EMERGENCY DEPARTMENT AT [facility] Provider Note   CSN: [id number] Arrival date & time: 07/01/24  1016     Patient presents with: Back Pain   Victoria Bailey is a 68 y.o. female presenting for evaluation of acute on chronic low back pain.  She has a history of chronic pain and is in pain management including use of a spine stimulator, also is currently recovering from a stable pelvic fracture under the care of Dr. Margrette, here today as she has escalating pain in her lumbar region after her shower chair partially broke.  Uses a walker at baseline.  She states she did not fall directly onto the floor but several of the slats of the chair broke while she was sitting on it causing her to partially fall through the seat section.  Since then she has had worse pain.  This injury occurred last week.  She was seen by Dr. Margrette 5 days ago during which time she had a pelvic film confirming her pelvic fracture is continuing to heal, her new injury actually occurred prior to that visit.  She currently takes hydrocodone  every 4 hours for her chronic pain, along with Robaxin .  She denies any new or worsening radiation of pain, denies urinary or fecal incontinence or retention.   The history is provided by the patient and the spouse.       Prior to Admission medications   Medication Sig Start Date End Date Taking? Authorizing Provider  ondansetron  (ZOFRAN -ODT) 4 MG disintegrating tablet Take 1 tablet (4 mg total) by mouth every 8 (eight) hours as needed for nausea or vomiting. 07/01/24  Yes Laniya Friedl, PA-C  oxyCODONE -acetaminophen  (PERCOCET/ROXICET) 5-325 MG tablet Take 1 tablet by mouth every 6 (six) hours as needed for severe pain (pain score 7-10). 07/01/24  Yes Ebonie Westerlund, Mliss, PA-C  predniSONE  (DELTASONE ) 10 MG tablet 6, 5, 4, 3, 2 then 1 tablet by mouth daily for 6 days total. 07/01/24  Yes Angeliki Mates, Mliss, PA-C  amitriptyline  (ELAVIL ) 10 MG tablet Take 10 mg by mouth at bedtime.     [provider]  ARIPiprazole (ABILIFY) 5 MG tablet Take 5 mg by mouth daily.    [provider]  azelastine (ASTELIN) 0.1 % nasal spray Place 2 sprays into both nostrils daily as needed for rhinitis.     [provider]  buPROPion (WELLBUTRIN XL) 150 MG 24 hr tablet Take 150 mg by mouth 3 (three) times daily. Patient not taking: Reported on 06/25/2024 09/05/18   [provider]  calcitonin, salmon, (MIACALCIN/FORTICAL) 200 UNIT/ACT nasal spray Place 1 spray into alternate nostrils daily.    [provider]  Calcium Carb-Cholecalciferol (CALCIUM 600/VITAMIN D3 PO) Take 1 tablet by mouth 2 (two) times daily.    [provider]  cetirizine (ZYRTEC) 10 MG tablet Take 10 mg by mouth daily as needed for allergies.     [provider]  Cholecalciferol 125 MCG (5000 UT) TABS Take 5,000 Units by mouth daily.    [provider]  esomeprazole (NEXIUM) 40 MG capsule Take 40 mg by mouth daily.     [provider]  levothyroxine (SYNTHROID) 25 MCG tablet Take 25 mcg by mouth daily before breakfast. 09/26/22   [provider]  lidocaine  (LIDODERM ) 5 % Place 1 patch onto the skin daily. Remove & Discard patch within 12 hours or as directed by MD 05/24/24   Melvenia Motto, MD  methocarbamol  (ROBAXIN ) 750 MG tablet Take 1 tablet (750  mg total) by mouth 4 (four) times daily. 06/25/24   Margrette Taft BRAVO, MD  naloxone [facility]) nasal spray 4 mg/0.1 mL Place 1 spray into the nose once. 10/10/20   [provider]  naratriptan (AMERGE) 2.5 MG tablet Take 2.5 mg by mouth daily as needed for migraine.  08/25/18   [provider]  sertraline (ZOLOFT) 25 MG tablet Take 25 mg by mouth daily. 12/06/22   [provider]  topiramate (TOPAMAX) 100 MG tablet Take 100 mg by mouth 2 (two) times daily.    [provider]  verapamil (CALAN-SR) 240 MG CR tablet Take 240 mg by mouth daily. 08/25/18   [provider]    Allergies: Latex, Nsaids, Oxycodone  hcl, Oxycontin  [oxycodone ], Sulfa drugs cross reactors, Zonisamide, and Doxycycline    Review of Systems  Constitutional:  Negative for fever.  Respiratory:  Negative for shortness of breath.   Cardiovascular:  Negative for chest pain and leg swelling.  Gastrointestinal:  Negative for abdominal distention, abdominal pain and constipation.  Genitourinary:  Negative for difficulty urinating, dysuria, flank pain, frequency and urgency.  Musculoskeletal:  Positive for back pain. Negative for gait problem and joint swelling.  Skin:  Negative for rash.  Neurological:  Negative for weakness and numbness.  All other systems reviewed and are negative.   Updated Vital Signs BP (!) 148/73 (BP Location: Right Arm)   Pulse 62   Temp 97.8 F (36.6 C) (Oral)   Resp 16   Ht 5' 7 (1.702 m)   Wt 82.6 kg   SpO2 95%   BMI 28.51 kg/m   Physical Exam Vitals and nursing note reviewed.  Constitutional:      Appearance: She is well-developed.  HENT:     Head: Normocephalic.  Eyes:     Conjunctiva/sclera: Conjunctivae normal.  Cardiovascular:     Rate and Rhythm: Normal rate.     Pulses: Normal pulses.     Comments: Pedal pulses normal. Pulmonary:     Effort: Pulmonary effort is normal.  Abdominal:     General: Bowel sounds are normal. There is no distension.     Palpations: Abdomen is soft. There is no mass.  Musculoskeletal:        General: Normal range of motion.     Cervical back: Normal range of motion and neck supple.     Lumbar back: Tenderness present. No swelling, edema or spasms.  Skin:    General: Skin is warm and dry.  Neurological:     General: No focal deficit present.     Mental Status: She is alert.     Sensory: No sensory deficit.     Motor: No tremor or atrophy.     Gait: Gait normal.     Deep Tendon Reflexes:     Reflex Scores:      Patellar reflexes are 2+ on the right side and 2+ on the left side.       Achilles reflexes are 2+ on the right side and 2+ on the left side.    Comments: No strength deficit noted in hip and knee flexor and extensor muscle groups.  Ankle flexion and extension intact.     (all labs ordered are listed, but only abnormal results are displayed) Labs Reviewed - No data to display  EKG: None  Radiology: CT Lumbar Spine Wo Contrast Result Date: 07/01/2024 CLINICAL DATA:  Lumbar radiculopathy.  Fall in shower. EXAM: CT LUMBAR SPINE WITHOUT CONTRAST TECHNIQUE: Multidetector CT imaging of  the lumbar spine was performed without intravenous contrast administration. Multiplanar CT image reconstructions were also generated. RADIATION DOSE REDUCTION: This exam was performed according to the departmental dose-optimization program which includes automated exposure control, adjustment of the mA and/or kV according to patient size and/or use of iterative reconstruction technique. COMPARISON:  Report from 02/12/2016 FINDINGS: Segmentation: The lowest lumbar type non-rib-bearing vertebra is labeled as L5. Alignment: 22 degrees dextroconvex lumbar scoliosis as measured between T12 and L4. Mild rotary component. Vertebrae: Posterolateral rod and pedicle screw fixation at L3-L4-L5 bilaterally with interbody graft material at L4-5 posterior decompression extending from L3 through L5 pedicle screw alignment and positioning satisfactory without surrounding abnormal lucency. No lumbar spine fracture or acute bony findings. Paraspinal and other soft tissues: Bilateral leads extend into the spinal canal at the T12-L1 level and extend cephalad. Atherosclerosis is present, including aortoiliac atherosclerotic disease. Left nephrectomy. Disc levels: T12-L1: Unremarkable L1-2: No impingement, left lateral recess and inferior foraminal disc protrusion. L2-3: No impingement.  Mild disc bulge. L3-4: No impingement.  Fused level. L4-5: No impingement.  Fused level. L5-S1: No impingement. Absent spinous process of  L5. Mild disc bulge and mild right inferior foraminal intervertebral spurring. IMPRESSION: 1. No acute lumbar spine findings. 2. 22 degrees dextroconvex lumbar scoliosis with mild rotary component. 3. Posterolateral rod and pedicle screw fixation at L3-L4-L5 bilaterally with interbody graft material at L4-5. 4. No significant impingement identified. 5.  Aortic Atherosclerosis (ICD10-I70.0). Electronically Signed   By: Ryan Salvage M.D.   On: 07/01/2024 13:39     Procedures   Medications Ordered in the ED  HYDROcodone -acetaminophen  (NORCO/VICODIN) 5-325 MG per tablet 2 tablet (2 tablets Oral Given 07/01/24 1252)  predniSONE  (DELTASONE ) tablet 60 mg (60 mg Oral Given 07/01/24 1410)                                    Medical Decision Making Patient presenting with pain in her lumbar spine after a jolting injury when her shower chair partially broke 1 week ago.  She has no concerning symptoms or physical exam findings to suggest cauda equina or other surgical neurologic emergency.  We did obtain a CT of her lumbar spine given her history of osteoporosis in order to rule out injury including compression fracture.  This was negative.  She was encouraged to continue taking her Robaxin .  We discussed various other options, she would like a prednisone  taper which is reasonable, although oxycodone  is listed as an allergy as she states she can tolerate this medicine but it makes her nauseous.  This was prescribed to be used in place of her hydrocodone  for several days then returning to her hydrocodone , also encouraged to continue her Robaxin , also discussed role of heat and ice.  Plan follow-up with her pain specialist, of note she states her pain specialist has retired and she is scheduled to establish with a new provider in the same office next week.  Narcotic database was reviewed.  Amount and/or Complexity of Data Reviewed Radiology: ordered.    Details: CT lumbar reviewed, no acute lumbar  findings.  Risk Prescription drug management.        Final diagnoses:  Acute right-sided low back pain with right-sided sciatica  Sciatica of right side    ED Discharge Orders          Ordered    predniSONE  (DELTASONE ) 10 MG tablet  07/01/24 1411    oxyCODONE -acetaminophen  (PERCOCET/ROXICET) 5-325 MG tablet  Every 6 hours PRN        07/01/24 1411    ondansetron  (ZOFRAN -ODT) 4 MG disintegrating tablet  Every 8 hours PRN        07/01/24 1411               Sheena Simonis, PA-C 07/02/24 1551    Garrick Charleston, MD 07/03/24 1655

## 2024-07-01 NOTE — ED Notes (Signed)
 Patient transported to CT

## 2024-07-08 ENCOUNTER — Ambulatory Visit: Admitting: Orthopedic Surgery

## 2024-08-09 ENCOUNTER — Other Ambulatory Visit: Payer: Self-pay | Admitting: Orthopedic Surgery

## 2024-08-09 DIAGNOSIS — M545 Low back pain, unspecified: Secondary | ICD-10-CM

## 2024-08-11 ENCOUNTER — Encounter (INDEPENDENT_AMBULATORY_CARE_PROVIDER_SITE_OTHER): Payer: Self-pay | Admitting: Gastroenterology

## 2024-08-30 ENCOUNTER — Encounter: Payer: Self-pay | Admitting: Radiology
